# Patient Record
Sex: Male | Born: 1950 | Race: White | Hispanic: No | Marital: Married | Smoking: Former smoker
Health system: Southern US, Community
[De-identification: ages and names within clinical notes are randomized; demographics above are authoritative.]

## PROBLEM LIST (undated history)

## (undated) DIAGNOSIS — M1990 Unspecified osteoarthritis, unspecified site: Secondary | ICD-10-CM

## (undated) DIAGNOSIS — M170 Bilateral primary osteoarthritis of knee: Secondary | ICD-10-CM

## (undated) DIAGNOSIS — E538 Deficiency of other specified B group vitamins: Secondary | ICD-10-CM

## (undated) DIAGNOSIS — N200 Calculus of kidney: Secondary | ICD-10-CM

## (undated) DIAGNOSIS — Z87442 Personal history of urinary calculi: Secondary | ICD-10-CM

## (undated) HISTORY — DX: Calculus of kidney: N20.0

## (undated) HISTORY — DX: Bilateral primary osteoarthritis of knee: M17.0

## (undated) HISTORY — PX: TONSILLECTOMY: SUR1361

## (undated) HISTORY — DX: Unspecified osteoarthritis, unspecified site: M19.90

## (undated) HISTORY — PX: COLONOSCOPY: SHX174

---

## 2008-02-25 ENCOUNTER — Emergency Department: Payer: Self-pay | Admitting: Emergency Medicine

## 2008-04-20 ENCOUNTER — Observation Stay: Payer: Self-pay | Admitting: Urology

## 2008-07-22 ENCOUNTER — Emergency Department: Payer: Self-pay | Admitting: Unknown Physician Specialty

## 2008-12-13 ENCOUNTER — Ambulatory Visit: Payer: Self-pay | Admitting: Urology

## 2008-12-21 ENCOUNTER — Ambulatory Visit: Payer: Self-pay | Admitting: Urology

## 2009-01-03 ENCOUNTER — Ambulatory Visit: Payer: Self-pay | Admitting: Urology

## 2009-01-10 ENCOUNTER — Ambulatory Visit: Payer: Self-pay | Admitting: Urology

## 2011-07-11 ENCOUNTER — Ambulatory Visit: Payer: Self-pay | Admitting: Unknown Physician Specialty

## 2011-07-14 LAB — PATHOLOGY REPORT

## 2012-09-23 ENCOUNTER — Emergency Department: Payer: Self-pay | Admitting: Emergency Medicine

## 2013-08-19 DIAGNOSIS — N200 Calculus of kidney: Secondary | ICD-10-CM

## 2013-08-19 HISTORY — DX: Calculus of kidney: N20.0

## 2014-04-10 DIAGNOSIS — M170 Bilateral primary osteoarthritis of knee: Secondary | ICD-10-CM | POA: Insufficient documentation

## 2014-04-10 HISTORY — DX: Bilateral primary osteoarthritis of knee: M17.0

## 2014-07-07 NOTE — Op Note (Signed)
PATIENT NAME:  Danny Castro, Danny Castro MR#:  [id number] DATE OF BIRTH:  November 08, 1950  DATE OF PROCEDURE:  09/23/2012  PREPROCEDURE DIAGNOSIS: Retained nail in right thumb post nail gun injury.   POSTPROCEDURE DIAGNOSIS: Retained nail in right thumb post nail gun injury.   PROCEDURE: Removal of nail, right thumb.  ANESTHESIA: Local.   PROCEDURE: The patient was first given 10 mL of 1% Xylocaine as a digital block. After allowing this to set, the head of the screw, which was on the ulnar side, was grasped with a vice grip and the nail was removed without difficulty. There was some excessive bleeding from the more ulnar puncture wound and it appeared there was a small arterial injury. The patient had previously been given local anesthetic and neurovascular status could not be completely ascertained, but the puncture wound appeared to be entirely on the extensor side and the x-rays did not appear to show it penetrating the bone and with removal it did not appear to have gone through the bone. The wounds were addressed by ER staff. The patient is to follow up with me next week to check for infection.   COMPLICATIONS: None.   SPECIMEN: None. This nail was discarded in the sharps drawer.    ____________________________ Leitha SchullerMichael J. Shivangi Lutz, MD mjm:aw Castro: 09/23/2012 21:39:25 ET T: 09/24/2012 06:26:10 ET JOB#: [id number]  cc: Leitha SchullerMichael J. Charlet Harr, MD, <Dictator> Leitha SchullerMICHAEL J Adiya Selmer MD ELECTRONICALLY SIGNED 09/24/2012 6:55

## 2014-11-29 ENCOUNTER — Encounter: Payer: Self-pay | Admitting: Urology

## 2014-11-29 ENCOUNTER — Ambulatory Visit (INDEPENDENT_AMBULATORY_CARE_PROVIDER_SITE_OTHER): Payer: 59 | Admitting: Urology

## 2014-11-29 VITALS — BP 115/79 | HR 82 | Ht 70.0 in | Wt 162.6 lb

## 2014-11-29 DIAGNOSIS — R310 Gross hematuria: Secondary | ICD-10-CM | POA: Diagnosis not present

## 2014-11-29 NOTE — Progress Notes (Signed)
11/29/2014 3:53 PM   Danny Castro Mar 30, 1950 [id number]  Referring provider: Danella Penton, MD [address] [place], [place] [address]  Chief Complaint  Patient presents with  . Hematuria    possible nephrolithiasis, because pt got a history of stones. intermittent lower pelvic pain     HPI: The patient is a 64 year old male with a past medical history for nephrolithiasis who presents for microscopic hematuria. His last bout of kidney stones was in 2013. He does not believe he has passed one since. All previous stones were treated with shockwave lithotripsy. He denies any other urological problems including no nocturia, frequency, urgency, incontinence and, straining. Of note he had a recent PSA with his primary care doctor 0.5. His digital rectal exam at that time and would like to defer a rectal exam today.   PMH: Past Medical History  Diagnosis Date  . Nephrolithiasis   . OA (osteoarthritis)   . Calculus of kidney 08/19/2013  . Primary osteoarthritis of both knees 04/10/2014    Surgical History: Past Surgical History  Procedure Laterality Date  . Tonsillectomy      Home Medications:    Medication List       This list is accurate as of: 11/29/14  3:53 PM.  Always use your most recent med list.               RA NAPROXEN SODIUM 220 MG tablet  Generic drug:  naproxen sodium  Take 220 mg by mouth 2 (two) times daily.        Allergies: No Known Allergies  Family History: Family History  Problem Relation Age of Onset  . Prostate cancer Father   . Nephrolithiasis Father     Social History:  reports that he quit smoking about 4 years ago. He does not have any smokeless tobacco history on file. He reports that he drinks alcohol. He reports that he does not use illicit drugs.  ROS: UROLOGY Frequent Urination?: No Hard to postpone urination?: No Burning/pain with urination?: Yes Get up at night to urinate?: No Leakage of urine?: No Urine stream starts  and stops?: No Trouble starting stream?: No Do you have to strain to urinate?: No Blood in urine?: Yes Urinary tract infection?: No Sexually transmitted disease?: No Injury to kidneys or bladder?: No Painful intercourse?: No Weak stream?: No Erection problems?: No Penile pain?: No  Gastrointestinal Nausea?: No Vomiting?: No Indigestion/heartburn?: Yes Diarrhea?: No Constipation?: No  Constitutional Fever: No Night sweats?: No Weight loss?: No Fatigue?: Yes  Skin Skin rash/lesions?: No Itching?: No  Eyes Blurred vision?: Yes Double vision?: No  Ears/Nose/Throat Sore throat?: No Sinus problems?: Yes  Hematologic/Lymphatic Swollen glands?: No Easy bruising?: No  Cardiovascular Leg swelling?: No Chest pain?: No  Respiratory Cough?: No Shortness of breath?: No  Endocrine Excessive thirst?: No  Musculoskeletal Back pain?: Yes Joint pain?: Yes  Neurological Headaches?: Yes Dizziness?: Yes  Psychologic Depression?: Yes Anxiety?: Yes  Physical Exam: BP 115/79 mmHg  Pulse 82  Ht  (1.778 m)  Wt 162 lb 9.6 oz (73.755 kg)  BMI 23.33 kg/m2  Constitutional:  Alert and oriented, No acute distress. HEENT: [place] AT, moist mucus membranes.  Trachea midline, no masses. Cardiovascular: No clubbing, cyanosis, or edema. Respiratory: Normal respiratory effort, no increased work of breathing. GI: Abdomen is soft, nontender, nondistended, no abdominal masses GU: No CVA tenderness. Normal phallus. Testicles descended equal bilateral. No masses. DRE: Deferred per patient request. Skin: No rashes, bruises or suspicious  lesions. Lymph: No cervical or inguinal adenopathy. Neurologic: Grossly intact, no focal deficits, moving all 4 extremities. Psychiatric: Normal mood and affect.  Laboratory Data: PSA: 0.5 (8/16)  Urinalysis No results found for: COLORURINE, APPEARANCEUR, LABSPEC, PHURINE, GLUCOSEU, HGBUR, BILIRUBINUR, KETONESUR, PROTEINUR, UROBILINOGEN,  NITRITE, LEUKOCYTESUR   Assessment & Plan:    I discussed the patient that the workup for microscopic hematuria is very standardized. I discussed with him that there is a good chance that the etiology is from nephrolithiasis given his history, however especially given his smoking history, a full workup is necessary. We will start the process today.  The patient is re: been screened by his primary care doctor for prostate cancer in August. His PSA is unremarkable. He deferred a digital rectal exam today. I did discuss with him the controversial guidelines of prostate cancer screening. In particular, the variance between the American Urological Association in the USPTF.  1. Microscopic Hematuria - Urinalysis, Complete - CULTURE, URINE COMPREHENSIVE - CT Abdomen Pelvis W Wo Contrast; Urogram - Cystoscopy  2. Prostate Cancer Screening - Repeat PSA August 2016.  Patient deferred a rectal exam today, but it was normal per the patient at his last physical in August.  Return in about 2 weeks (around 12/13/2014) for cystoscopy.  After CT Urogram.  Hildred Laser, MD  [facility] [address], [address] [place], [place] [address] [phone]

## 2014-11-30 LAB — MICROSCOPIC EXAMINATION
BACTERIA UA: NONE SEEN
EPITHELIAL CELLS (NON RENAL): NONE SEEN /HPF (ref 0–10)

## 2014-11-30 LAB — URINALYSIS, COMPLETE
Bilirubin, UA: NEGATIVE
Glucose, UA: NEGATIVE
Leukocytes, UA: NEGATIVE
NITRITE UA: NEGATIVE
PH UA: 5.5 (ref 5.0–7.5)
Protein, UA: NEGATIVE
RBC UA: NEGATIVE
Specific Gravity, UA: >1.03 — ABNORMAL HIGH (ref 1.005–1.030)
UUROB: 0.2 mg/dL (ref 0.2–1.0)

## 2014-12-03 LAB — CULTURE, URINE COMPREHENSIVE

## 2014-12-05 ENCOUNTER — Other Ambulatory Visit: Payer: Self-pay | Admitting: Urology

## 2014-12-05 ENCOUNTER — Telehealth: Payer: Self-pay | Admitting: Urology

## 2014-12-05 MED ORDER — CIPROFLOXACIN HCL 500 MG PO TABS
500.0000 mg | ORAL_TABLET | Freq: Two times a day (BID) | ORAL | Status: DC
Start: 2014-12-05 — End: 2014-12-28

## 2014-12-05 NOTE — Telephone Encounter (Signed)
Left mess to call 

## 2014-12-05 NOTE — Telephone Encounter (Signed)
-----   Message from Hildred Laser, MD sent at 12/05/2014  9:18 AM EDT ----- Mr. Mikami has a positive urine culture.  Can we call him to let him know to pick up Cipro at this pharmacy to treat it?  Thanks

## 2014-12-06 ENCOUNTER — Encounter: Payer: Self-pay | Admitting: Urology

## 2014-12-11 ENCOUNTER — Ambulatory Visit: Admission: RE | Admit: 2014-12-11 | Payer: 59 | Source: Ambulatory Visit

## 2014-12-12 NOTE — Telephone Encounter (Signed)
Left pt mess to call/SW 

## 2014-12-13 ENCOUNTER — Ambulatory Visit: Payer: 59

## 2014-12-14 ENCOUNTER — Other Ambulatory Visit: Payer: 59

## 2014-12-14 NOTE — Telephone Encounter (Signed)
Spoke with patient and he states he received my vmail and he started the abx for treatment of UTI/SW

## 2014-12-15 ENCOUNTER — Ambulatory Visit
Admission: RE | Admit: 2014-12-15 | Discharge: 2014-12-15 | Disposition: A | Discharge status: Home / Self Care | Payer: 59 | Source: Ambulatory Visit | Attending: Urology | Admitting: Urology

## 2014-12-15 DIAGNOSIS — N200 Calculus of kidney: Secondary | ICD-10-CM | POA: Insufficient documentation

## 2014-12-15 DIAGNOSIS — R310 Gross hematuria: Secondary | ICD-10-CM

## 2014-12-15 MED ORDER — IOHEXOL 300 MG/ML  SOLN
150.0000 mL | Freq: Once | INTRAMUSCULAR | Status: AC | PRN
  Administered 2014-12-15: 125 mL via INTRAVENOUS

## 2014-12-28 ENCOUNTER — Ambulatory Visit (INDEPENDENT_AMBULATORY_CARE_PROVIDER_SITE_OTHER): Payer: 59 | Admitting: Urology

## 2014-12-28 ENCOUNTER — Encounter: Payer: Self-pay | Admitting: Urology

## 2014-12-28 VITALS — BP 137/82 | HR 88 | Ht 70.0 in | Wt 162.9 lb

## 2014-12-28 DIAGNOSIS — N200 Calculus of kidney: Secondary | ICD-10-CM

## 2014-12-28 MED ORDER — CIPROFLOXACIN HCL 500 MG PO TABS: 500.0000 mg | ORAL_TABLET | Freq: Once | ORAL | Status: DC

## 2014-12-28 MED ORDER — LIDOCAINE HCL 2 % EX GEL: 1.0000 "application " | Freq: Once | CUTANEOUS | Status: DC

## 2014-12-28 NOTE — Addendum Note (Signed)
Addended by: Martha ClanWATTS, SARAH M on: 12/28/2014 04:38 PM   Modules accepted: Orders

## 2014-12-28 NOTE — Progress Notes (Signed)
12/28/2014 4:10 PM   Danny Castro 04/06/1950 [id number]  Referring provider: Danella PentonMark F Miller, MD [phone] [facility] [place], [place] [id number]  Chief Complaint  Patient presents with  . Cysto    HPI: The patient transferred completion of his microhematuria workup. CT scan showed bilateral nephrolithiasis with stone burden worse on the right side. He does have a history of stones. He was last treated with ESWL in 2013. He has passed 3 stones before.  He is adamantly in refusing a cystoscopy at this time.   PMH: Past Medical History  Diagnosis Date  . Nephrolithiasis   . OA (osteoarthritis)   . Calculus of kidney 08/19/2013  . Primary osteoarthritis of both knees 04/10/2014    Surgical History: Past Surgical History  Procedure Laterality Date  . Tonsillectomy      Home Medications:    Medication List       This list is accurate as of: 12/28/14  4:10 PM.  Always use your most recent med list.               ciprofloxacin 500 MG tablet  Commonly known as:  CIPRO  Take 1 tablet (500 mg total) by mouth every 12 (twelve) hours.     RA NAPROXEN SODIUM 220 MG tablet  Generic drug:  naproxen sodium  Take 220 mg by mouth 2 (two) times daily.        Allergies: No Known Allergies  Family History: Family History  Problem Relation Age of Onset  . Prostate cancer Father   . Nephrolithiasis Father     Social History:  reports that he quit smoking about 4 years ago. He does not have any smokeless tobacco history on file. He reports that he drinks alcohol. He reports that he does not use illicit drugs.  ROS:                                        Physical Exam: There were no vitals taken for this visit.  Constitutional:  Alert and oriented, No acute distress. HEENT: [place] AT, moist mucus membranes.  Trachea midline, no masses. Cardiovascular: No clubbing, cyanosis, or edema. Respiratory: Normal respiratory  effort, no increased work of breathing. GI: Abdomen is soft, nontender, nondistended, no abdominal masses GU: No CVA tenderness.  Skin: No rashes, bruises or suspicious lesions. Lymph: No cervical or inguinal adenopathy. Neurologic: Grossly intact, no focal deficits, moving all 4 extremities. Psychiatric: Normal mood and affect.  Laboratory Data: No results found for: WBC, HGB, HCT, MCV, PLT  No results found for: CREATININE  No results found for: PSA  No results found for: TESTOSTERONE  No results found for: HGBA1C  Urinalysis    Component Value Date/Time   GLUCOSEU Negative 11/29/2014 1520   BILIRUBINUR Negative 11/29/2014 1520   NITRITE Negative 11/29/2014 1520   LEUKOCYTESUR Negative 11/29/2014 1520    Pertinent Imaging:  CLINICAL DATA: Micro hematuria. History of renal calculi.  EXAM: CT ABDOMEN AND PELVIS WITHOUT AND WITH CONTRAST  TECHNIQUE: Multidetector CT imaging of the abdomen and pelvis was performed following the standard protocol before and following the bolus administration of intravenous contrast.  CONTRAST: 125mL OMNIPAQUE IOHEXOL 300 MG/ML SOLN  COMPARISON: CT 04/21/2007  FINDINGS: Lower chest: Lung bases are clear.  Hepatobiliary: No focal hepatic lesion. No biliary duct dilatation. Gallbladder is normal. Common bile duct is normal.  Pancreas: Pancreas is normal. No ductal dilatation. No pancreatic inflammation.  Spleen: Normal spleen  Adrenals/urinary tract: Adrenal glands normal.  Non IV contrast images demonstrate 8 calculi within the RIGHT kidney ranging in size from 1 to 7 mm. Six calculi within the LEFT kidney ranging in size from 2 to 7 mm. No ureterolithiasis or obstructive uropathy.  Delayed cortical phase imaging demonstrates no enhancing renal cortical lesion. Delayed pyelogram phase imaging demonstrates no filling defects in collecting systems or ureters.  No bladder calculi, enhancing bladder lesions,  or filling defect within the bladder.  Stomach/Bowel: Stomach, small-bowel, appendix, cecum normal. Central mesenteries mild fat stranding and multiple small lymph nodes scattered throughout the mesenteries best seen on coronal images 48 through 63. This finding is progressed from 2010 but was evident at that time.  Vascular/Lymphatic: Abdominal aorta is normal caliber. There is no retroperitoneal or periportal lymphadenopathy. No pelvic lymphadenopathy.  Reproductive: Prostate gland is normal. Normal seminal vesicles.  Musculoskeletal: No aggressive osseous lesion.  Other: No free fluid.  IMPRESSION: 1. Bilateral nephrolithiasis. No ureterolithiasis or obstructive uropathy. 2. No enhancing renal cortical lesion or filling defect within the collecting systems. 3. No bladder stones or filling defects in the bladder which does not excluded a bladder lesion. 4. Prominent central mesenteric lymph nodes progressed from 2010 are favored benign. 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     Cystoscopy Procedure Note  Patient identification was confirmed, informed consent was obtained, and patient was prepped using Betadine solution.  Lidocaine jelly was administered per urethral meatus.     Assessment & Plan:    1. Calculus of kidney He has multiple calculi on both sides. The stone burden on the right is worse. He is at least 2 stones around 6-7 cm on the right side. We will start with lithotripsy on the right. He may need additional procedures on the left future. The patient is aware of the risks, benefits, and alternatives to lithotripsy. He wishes to proceed. Once he recovers from this surgery, we'll plan to do a 24-hour urine study on him due to his recurrent stone formation. - Urinalysis, Complete   2.  Microhematuria -Patient is refusing cystoscopy. Otherwise negative work up except for stones. I stressed the necessity of a cystoscopy today, especially  given his smoking history. He will consider having it done in the future.   Follow up: Right ESWL  Hildred Laser, MD  [facility] [address] [place], [place] [address] [phone]

## 2014-12-29 LAB — URINALYSIS, COMPLETE
Bilirubin, UA: NEGATIVE
GLUCOSE, UA: NEGATIVE
Leukocytes, UA: NEGATIVE
Nitrite, UA: NEGATIVE
Protein, UA: NEGATIVE
Specific Gravity, UA: 1.025 (ref 1.005–1.030)
Urobilinogen, Ur: 1 mg/dL (ref 0.2–1.0)
pH, UA: 6.5 (ref 5.0–7.5)

## 2014-12-29 LAB — MICROSCOPIC EXAMINATION
Epithelial Cells (non renal): NONE SEEN /hpf (ref 0–10)
Renal Epithel, UA: NONE SEEN /hpf
WBC UA: NONE SEEN /HPF (ref 0–?)

## 2014-12-30 LAB — CULTURE, URINE COMPREHENSIVE

## 2015-01-03 MED ORDER — PROMETHAZINE HCL 25 MG/ML IJ SOLN: 25.0000 mg | Freq: Once | INTRAMUSCULAR | Status: DC

## 2015-01-04 ENCOUNTER — Encounter
Admission: RE | Disposition: A | Discharge status: Home / Self Care | Payer: Self-pay | Source: Ambulatory Visit | Attending: Urology

## 2015-01-04 ENCOUNTER — Ambulatory Visit
Admission: RE | Admit: 2015-01-04 | Discharge: 2015-01-04 | Disposition: A | Discharge status: Home / Self Care | Payer: 59 | Source: Ambulatory Visit | Attending: Urology | Admitting: Urology

## 2015-01-04 ENCOUNTER — Ambulatory Visit: Payer: 59

## 2015-01-04 ENCOUNTER — Encounter: Payer: Self-pay | Admitting: *Deleted

## 2015-01-04 DIAGNOSIS — N200 Calculus of kidney: Secondary | ICD-10-CM | POA: Diagnosis not present

## 2015-01-04 HISTORY — PX: EXTRACORPOREAL SHOCK WAVE LITHOTRIPSY: SHX1557

## 2015-01-04 SURGERY — LITHOTRIPSY, ESWL
Anesthesia: Moderate Sedation | Laterality: Right

## 2015-01-04 MED ORDER — CIPROFLOXACIN HCL 500 MG PO TABS
ORAL_TABLET | ORAL | Status: AC
  Filled 2015-01-04: qty 1

## 2015-01-04 MED ORDER — ONDANSETRON HCL 4 MG/2ML IJ SOLN
4.0000 mg | Freq: Once | INTRAMUSCULAR | Status: AC
  Administered 2015-01-04: 4 mg via INTRAVENOUS

## 2015-01-04 MED ORDER — DEXTROSE-NACL 5-0.45 % IV SOLN
INTRAVENOUS | Status: DC
  Administered 2015-01-04: 13:00:00 via INTRAVENOUS

## 2015-01-04 MED ORDER — DIAZEPAM 5 MG PO TABS
ORAL_TABLET | ORAL | Status: AC
  Filled 2015-01-04: qty 2

## 2015-01-04 MED ORDER — DIPHENHYDRAMINE HCL 25 MG PO CAPS
ORAL_CAPSULE | ORAL | Status: AC
  Filled 2015-01-04: qty 1

## 2015-01-04 MED ORDER — DIPHENHYDRAMINE HCL 25 MG PO CAPS
25.0000 mg | ORAL_CAPSULE | ORAL | Status: AC
  Administered 2015-01-04: 25 mg via ORAL

## 2015-01-04 MED ORDER — CIPROFLOXACIN HCL 500 MG PO TABS
500.0000 mg | ORAL_TABLET | ORAL | Status: AC
  Administered 2015-01-04: 500 mg via ORAL

## 2015-01-04 MED ORDER — DIAZEPAM 5 MG PO TABS
10.0000 mg | ORAL_TABLET | ORAL | Status: AC
  Administered 2015-01-04: 10 mg via ORAL

## 2015-01-04 MED ORDER — ONDANSETRON HCL 4 MG/2ML IJ SOLN
INTRAMUSCULAR | Status: AC
  Filled 2015-01-04: qty 2

## 2015-01-04 NOTE — Progress Notes (Signed)
Advises he did mag citrate at home yest pm Bathroom void preop 145 pm prior to transport

## 2015-01-04 NOTE — Discharge Instructions (Signed)

## 2015-01-19 ENCOUNTER — Encounter: Payer: Self-pay | Admitting: Urology

## 2015-01-19 ENCOUNTER — Ambulatory Visit: Payer: 59 | Admitting: Urology

## 2016-03-01 ENCOUNTER — Emergency Department: Payer: Medicare Other

## 2016-03-01 ENCOUNTER — Emergency Department
Admission: EM | Admit: 2016-03-01 | Discharge: 2016-03-01 | Disposition: A | Discharge status: Home / Self Care | Payer: Medicare Other | Attending: Emergency Medicine | Admitting: Emergency Medicine

## 2016-03-01 DIAGNOSIS — Z87891 Personal history of nicotine dependence: Secondary | ICD-10-CM | POA: Diagnosis not present

## 2016-03-01 DIAGNOSIS — R109 Unspecified abdominal pain: Secondary | ICD-10-CM | POA: Diagnosis present

## 2016-03-01 DIAGNOSIS — N200 Calculus of kidney: Secondary | ICD-10-CM | POA: Diagnosis not present

## 2016-03-01 LAB — URINALYSIS, COMPLETE (UACMP) WITH MICROSCOPIC
Bacteria, UA: NONE SEEN
Bilirubin Urine: NEGATIVE
GLUCOSE, UA: NEGATIVE mg/dL
HGB URINE DIPSTICK: NEGATIVE
Ketones, ur: 5 mg/dL — AB
Leukocytes, UA: NEGATIVE
NITRITE: NEGATIVE
PROTEIN: NEGATIVE mg/dL
Specific Gravity, Urine: 1.011 (ref 1.005–1.030)
Squamous Epithelial / LPF: NONE SEEN
pH: 8 (ref 5.0–8.0)

## 2016-03-01 LAB — CBC
HEMATOCRIT: 45.5 % (ref 40.0–52.0)
Hemoglobin: 15.4 g/dL (ref 13.0–18.0)
MCH: 30.4 pg (ref 26.0–34.0)
MCHC: 33.8 g/dL (ref 32.0–36.0)
MCV: 89.9 fL (ref 80.0–100.0)
Platelets: 194 10*3/uL (ref 150–440)
RBC: 5.06 MIL/uL (ref 4.40–5.90)
RDW: 14.5 % (ref 11.5–14.5)
WBC: 10.3 10*3/uL (ref 3.8–10.6)

## 2016-03-01 LAB — BASIC METABOLIC PANEL
Anion gap: 8 (ref 5–15)
BUN: 20 mg/dL (ref 6–20)
CO2: 24 mmol/L (ref 22–32)
Calcium: 8.7 mg/dL — ABNORMAL LOW (ref 8.9–10.3)
Chloride: 105 mmol/L (ref 101–111)
Creatinine, Ser: 0.88 mg/dL (ref 0.61–1.24)
GFR calc Af Amer: >60 mL/min (ref >60)
GLUCOSE: 130 mg/dL — AB (ref 65–99)
POTASSIUM: 4.4 mmol/L (ref 3.5–5.1)
Sodium: 137 mmol/L (ref 135–145)

## 2016-03-01 MED ORDER — HYDROMORPHONE HCL 1 MG/ML IJ SOLN
1.0000 mg | Freq: Once | INTRAMUSCULAR | Status: AC
  Administered 2016-03-01: 1 mg via INTRAVENOUS

## 2016-03-01 MED ORDER — ONDANSETRON HCL 4 MG/2ML IJ SOLN
INTRAMUSCULAR | Status: AC
  Administered 2016-03-01: 4 mg via INTRAVENOUS
  Filled 2016-03-01: qty 2

## 2016-03-01 MED ORDER — MORPHINE SULFATE (PF) 4 MG/ML IV SOLN
INTRAVENOUS | Status: AC
  Administered 2016-03-01: 4 mg via INTRAVENOUS
  Filled 2016-03-01: qty 1

## 2016-03-01 MED ORDER — TAMSULOSIN HCL 0.4 MG PO CAPS: 0.4000 mg | ORAL_CAPSULE | Freq: Every day | ORAL | 0 refills | Status: DC

## 2016-03-01 MED ORDER — KETOROLAC TROMETHAMINE 30 MG/ML IJ SOLN
INTRAMUSCULAR | Status: AC
  Administered 2016-03-01: 30 mg via INTRAVENOUS
  Filled 2016-03-01: qty 1

## 2016-03-01 MED ORDER — ONDANSETRON 4 MG PO TBDP: 4.0000 mg | ORAL_TABLET | Freq: Three times a day (TID) | ORAL | 0 refills | Status: DC | PRN

## 2016-03-01 MED ORDER — ONDANSETRON HCL 4 MG/2ML IJ SOLN
4.0000 mg | Freq: Once | INTRAMUSCULAR | Status: AC
  Administered 2016-03-01: 4 mg via INTRAVENOUS

## 2016-03-01 MED ORDER — MORPHINE SULFATE (PF) 4 MG/ML IV SOLN
4.0000 mg | Freq: Once | INTRAVENOUS | Status: AC
  Administered 2016-03-01: 4 mg via INTRAVENOUS

## 2016-03-01 MED ORDER — OXYCODONE-ACETAMINOPHEN 5-325 MG PO TABS
ORAL_TABLET | ORAL | Status: AC
  Filled 2016-03-01: qty 1

## 2016-03-01 MED ORDER — OXYCODONE-ACETAMINOPHEN 5-325 MG PO TABS
1.0000 | ORAL_TABLET | Freq: Once | ORAL | Status: AC
  Administered 2016-03-01: 1 via ORAL

## 2016-03-01 MED ORDER — SODIUM CHLORIDE 0.9 % IV BOLUS (SEPSIS)
1000.0000 mL | Freq: Once | INTRAVENOUS | Status: AC
  Administered 2016-03-01: 1000 mL via INTRAVENOUS

## 2016-03-01 MED ORDER — OXYCODONE-ACETAMINOPHEN 5-325 MG PO TABS: 1.0000 | ORAL_TABLET | ORAL | 0 refills | Status: DC | PRN

## 2016-03-01 MED ORDER — KETOROLAC TROMETHAMINE 30 MG/ML IJ SOLN
30.0000 mg | Freq: Once | INTRAMUSCULAR | Status: AC
  Administered 2016-03-01: 30 mg via INTRAVENOUS

## 2016-03-01 NOTE — ED Notes (Signed)
Pt to stat desk in wheelchair, pt in obvious pain, reports pain to right flank, reports hx kidney stones, reports n/v as well, pt taken to rm 11, primary nurse informed

## 2016-03-01 NOTE — ED Notes (Signed)
Patient reports being awoken out of sleep this morning at 0200 with severe right flank pain. Pt reports short/small bursts in urinary stream X 2 days. Pt reports prior kidney stones - last stone 2016 on left side; patient reports he had lithotripsy performed

## 2016-03-01 NOTE — ED Triage Notes (Signed)
Pt taken to rm 11. Pt reprots hx of kidney stones and states around 2 am he woke up with his normal pain from kidney stones but to his right lower quad. Pt usually has them on the left. +N/V

## 2016-03-01 NOTE — ED Notes (Signed)
  Reviewed d/c instructions, follow-up care, prescriptions, need to increase fluids. Pt verbalized understanding.

## 2016-03-01 NOTE — ED Provider Notes (Signed)
[facility] Emergency Department Provider Note    First MD Initiated Contact with Patient 03/01/16 0345     (approximate)  I have reviewed the triage vital signs and the nursing notes.   HISTORY  Chief Complaint Nephrolithiasis    HPI Danny Castro is a 65 y.o. male below list chronic medical conditions presents to the emergency department with acute onset of right flank pain 2 AM this morning that awoke him from sleep. Patient states current pain score is 10 out of 10. Patient admits to nausea and vomiting. Patient denies any fever or febrile on presentation temperature 97.7   Past Medical History:  Diagnosis Date  . Calculus of kidney 08/19/2013  . Nephrolithiasis   . OA (osteoarthritis)   . Primary osteoarthritis of both knees 04/10/2014    Patient Active Problem List   Diagnosis Date Noted  . Primary osteoarthritis of both knees 04/10/2014  . Calculus of kidney 08/19/2013    Past Surgical History:  Procedure Laterality Date  . EXTRACORPOREAL SHOCK WAVE LITHOTRIPSY Right 01/04/2015   Procedure: EXTRACORPOREAL SHOCK WAVE LITHOTRIPSY (ESWL);  Surgeon: Lorraine Laxichard D Hart, MD;  Location: [facility] ORS;  Service: Urology;  Laterality: Right;  . TONSILLECTOMY      Prior to Admission medications   Not on File    Allergies Patient has no known allergies.  Family History  Problem Relation Age of Onset  . Prostate cancer Father   . Nephrolithiasis Father     Social History Social History  Substance Use Topics  . Smoking status: Former Smoker    Quit date: 11/29/2010  . Smokeless tobacco: Not on file  . Alcohol use 0.0 oz/week    Review of Systems Constitutional: No fever/chills Eyes: No visual changes. ENT: No sore throat. Cardiovascular: Denies chest pain. Respiratory: Denies shortness of breath. Gastrointestinal: Positive for right flank pain. No nausea, no vomiting.  No diarrhea.  No constipation. Genitourinary: Negative for  dysuria. Musculoskeletal: Negative for back pain. Skin: Negative for rash. Neurological: Negative for headaches, focal weakness or numbness.  10-point ROS otherwise negative.  ____________________________________________   PHYSICAL EXAM:  VITAL SIGNS: ED Triage Vitals  Enc Vitals Group     BP 03/01/16 0350 (!) 186/97     Pulse Rate 03/01/16 0350 79     Resp 03/01/16 0350 (!) 22     Temp 03/01/16 0350 97.7 F (36.5 C)     Temp Source 03/01/16 0350 Oral     SpO2 03/01/16 0350 100 %     Weight 03/01/16 0350 165 lb (74.8 kg)     Height 03/01/16 0350 5\' 9"  (1.753 m)     Head Circumference --      Peak Flow --      Pain Score 03/01/16 0351 10     Pain Loc --      Pain Edu? --      Excl. in GC? --     Constitutional: Alert and oriented. Apparent discomfort  Eyes: Conjunctivae are normal. PERRL. EOMI. Head: Atraumatic. Mouth/Throat: Mucous membranes are moist.  Oropharynx non-erythematous. Neck: No stridor.   Cardiovascular: Normal rate, regular rhythm. Good peripheral circulation. Grossly normal heart sounds. Respiratory: Normal respiratory effort.  No retractions. Lungs CTAB. Gastrointestinal: Soft and nontender. No distention.  Musculoskeletal: No lower extremity tenderness nor edema. No gross deformities of extremities. Neurologic:  Normal speech and language. No gross focal neurologic deficits are appreciated.  Skin:  Skin is warm, dry and intact. No rash noted. Psychiatric: Mood  and affect are normal. Speech and behavior are normal.  ____________________________________________   LABS (all labs ordered are listed, but only abnormal results are displayed)  Labs Reviewed  BASIC METABOLIC PANEL - Abnormal; Notable for the following:       Result Value   Glucose, Bld 130 (*)    Calcium 8.7 (*)    All other components within normal limits  CBC  URINALYSIS, COMPLETE (UACMP) WITH MICROSCOPIC    RADIOLOGY I, Alta N Jolon Degante, personally viewed and evaluated these  images (plain radiographs) as part of my medical decision making, as well as reviewing the written report by the radiologist.  Ct Renal Stone Study  Result Date: 03/01/2016 CLINICAL DATA:  Woke up with severe RIGHT flank pain at 0200 hours. History of kidney stones and lithotripsy. EXAM: CT ABDOMEN AND PELVIS WITHOUT CONTRAST TECHNIQUE: Multidetector CT imaging of the abdomen and pelvis was performed following the standard protocol without IV contrast. COMPARISON:  Abdominal radiograph January 12, 2015 FINDINGS: LOWER CHEST: Dependent atelectasis. Heart is mildly enlarged. No pericardial effusions. HEPATOBILIARY: Normal. PANCREAS: Normal. SPLEEN: Normal. ADRENALS/URINARY TRACT: Kidneys are orthotopic, demonstrating normal size and morphology. Mild RIGHT hydronephrosis to the ureteral pelvic junction where a 6 mm calculus is present. Approximate 5 residual RIGHT nephrolithiasis, 2 within the RIGHT renal pelvis is measuring up to 6 mm. Approximate 11 LEFT nephrolithiasis measure up to 4 mm. No LEFT obstructive uropathy. Limited assessment for renal masses on this nonenhanced examination. The unopacified ureters are normal in course and caliber. Urinary bladder is partially distended and unremarkable. Normal adrenal glands. STOMACH/BOWEL: Very small hiatal hernia. The stomach, small and large bowel are normal in course and caliber without inflammatory changes, sensitivity decreased by lack of enteric contrast. Moderate amount of retained large bowel stool. Moderate sigmoid diverticulosis. Normal appendix. VASCULAR/LYMPHATIC: Aortoiliac vessels are normal in course and caliber, mild calcific atherosclerosis. "Misty" mesentery and prominent mesenteric lymph nodes measuring up to 11 mm are likely reactive. REPRODUCTIVE: Mild prostatomegaly. OTHER: No intraperitoneal free fluid or free air. MUSCULOSKELETAL: Non-acute. Small bilateral fat containing inguinal hernias. Severe L4-5 degenerative disc, moderate to severe at  L5-S1. Severe RIGHT L4-5, severe LEFT L5-S1 neural foraminal narrowing. IMPRESSION: 5 mm RIGHT ureteropelvic junction calculus results in mild hydronephrosis. Bilateral residual nephrolithiasis measuring up to 6 mm on the RIGHT. No LEFT obstructive uropathy. Mesenteric edema and borderline mesenteric lymphadenopathy is likely reactive. Electronically Signed   By: Awilda Metro M.D.   On: 03/01/2016 04:33     Procedures      INITIAL IMPRESSION / ASSESSMENT AND PLAN / ED COURSE  Pertinent labs & imaging results that were available during my care of the patient were reviewed by me and considered in my medical decision making (see chart for details).  Patient given IV morphine 4 mg and Toradol 30 mg without improvement of pain. Patient then given Dilantin 1 mg with pain improvement.   Clinical Course     ____________________________________________  FINAL CLINICAL IMPRESSION(S) / ED DIAGNOSES  Final diagnoses:  Right kidney stone     MEDICATIONS GIVEN DURING THIS VISIT:  Medications  sodium chloride 0.9 % bolus 1,000 mL (1,000 mLs Intravenous New Bag/Given 03/01/16 0358)  ketorolac (TORADOL) 30 MG/ML injection 30 mg (30 mg Intravenous Given 03/01/16 0400)  morphine 4 MG/ML injection 4 mg (4 mg Intravenous Given 03/01/16 0400)  ondansetron (ZOFRAN) injection 4 mg (4 mg Intravenous Given 03/01/16 0400)  HYDROmorphone (DILAUDID) injection 1 mg (1 mg Intravenous Given 03/01/16 0411)  NEW OUTPATIENT MEDICATIONS STARTED DURING THIS VISIT:  New Prescriptions   No medications on file    Modified Medications   No medications on file    Discontinued Medications   No medications on file     Note:  This document was prepared using Dragon voice recognition software and may include unintentional dictation errors.    Darci Currentandolph N Bitha Fauteux, MD 03/01/16 [phone]

## 2016-03-01 NOTE — ED Notes (Addendum)
Patient transported to CT 

## 2016-03-04 ENCOUNTER — Ambulatory Visit (INDEPENDENT_AMBULATORY_CARE_PROVIDER_SITE_OTHER): Payer: Medicare Other | Admitting: Urology

## 2016-03-04 ENCOUNTER — Encounter: Payer: Self-pay | Admitting: Urology

## 2016-03-04 VITALS — BP 135/84 | HR 103 | Ht 69.0 in | Wt 161.5 lb

## 2016-03-04 DIAGNOSIS — N200 Calculus of kidney: Secondary | ICD-10-CM | POA: Diagnosis not present

## 2016-03-04 LAB — URINALYSIS, COMPLETE
BILIRUBIN UA: NEGATIVE
Glucose, UA: NEGATIVE
Ketones, UA: NEGATIVE
LEUKOCYTES UA: NEGATIVE
Nitrite, UA: NEGATIVE
PH UA: 5.5 (ref 5.0–7.5)
PROTEIN UA: NEGATIVE
RBC UA: NEGATIVE
Specific Gravity, UA: 1.015 (ref 1.005–1.030)
Urobilinogen, Ur: 0.2 mg/dL (ref 0.2–1.0)

## 2016-03-04 LAB — MICROSCOPIC EXAMINATION: BACTERIA UA: NONE SEEN

## 2016-03-04 MED ORDER — TAMSULOSIN HCL 0.4 MG PO CAPS: 0.4000 mg | ORAL_CAPSULE | Freq: Every day | ORAL | 0 refills | Status: AC

## 2016-03-04 NOTE — Progress Notes (Signed)
03/04/2016 11:05 AM   Danny Castro [birth year] [id number]  Referring provider: Rusty Aus, MD [facility] [place], [place] [address]  Chief Complaint  Patient presents with  . Nephrolithiasis    HPI: Danny Castro is a 65yo her for evaluation of bilateral renal calculi and a right ureteral calculus.  This is his 5th stone event. His last event was over 1 year ago. He had ESWL over 1 year ago. 03/01/2016 he presented to [facility] with severe, sharp, intermittent, nonradiating right flank pain. He was diagnosed with a 26m R UPJ calculus with 2 4-678mright renal pelvis calculus. The pain is now mild, sharp, intermittent, located in the lower flank. He denies any LUTS. No hematuria. He has associated nausea. No exacerbating events. Pain alleviated with percocet. No other associated signs/symptoms   PMH: Past Medical History:  Diagnosis Date  . Calculus of kidney 08/19/2013  . Nephrolithiasis   . OA (osteoarthritis)   . Primary osteoarthritis of both knees 04/10/2014    Surgical History: Past Surgical History:  Procedure Laterality Date  . EXTRACORPOREAL SHOCK WAVE LITHOTRIPSY Right 01/04/2015   Procedure: EXTRACORPOREAL SHOCK WAVE LITHOTRIPSY (ESWL);  Surgeon: RiCollier FlowersMD;  Location: [facility] ORS;  Service: Urology;  Laterality: Right;  . TONSILLECTOMY      Home Medications:  Allergies as of 03/04/2016   No Known Allergies     Medication List       Accurate as of 03/04/16 11:05 AM. Always use your most recent med list.          omeprazole 20 MG capsule Commonly known as:  PRILOSEC Take by mouth.   ondansetron 4 MG disintegrating tablet Commonly known as:  ZOFRAN ODT Take 1 tablet (4 mg total) by mouth every 8 (eight) hours as needed for nausea or vomiting.   oxyCODONE-acetaminophen 5-325 MG tablet Commonly known as:  ROXICET Take 1 tablet by mouth every 4 (four) hours as needed for severe pain.   tamsulosin 0.4 MG Caps  capsule Commonly known as:  FLOMAX Take 1 capsule (0.4 mg total) by mouth daily after breakfast.       Allergies: No Known Allergies  Family History: Family History  Problem Relation Age of Onset  . Prostate cancer Father   . Nephrolithiasis Father     Social History:  reports that he quit smoking about 5 years ago. He does not have any smokeless tobacco history on file. He reports that he drinks alcohol. He reports that he does not use drugs.  ROS: UROLOGY Frequent Urination?: No Hard to postpone urination?: No Burning/pain with urination?: Yes Get up at night to urinate?: No Leakage of urine?: No Urine stream starts and stops?: No Trouble starting stream?: Yes Do you have to strain to urinate?: No Blood in urine?: No Urinary tract infection?: No Sexually transmitted disease?: No Injury to kidneys or bladder?: No Painful intercourse?: Yes Weak stream?: No Erection problems?: No Penile pain?: No  Gastrointestinal Nausea?: No Vomiting?: No Indigestion/heartburn?: No Diarrhea?: No Constipation?: No  Constitutional Fever: No Night sweats?: No Weight loss?: No Fatigue?: No  Skin Skin rash/lesions?: No Itching?: No  Eyes Blurred vision?: No Double vision?: No  Ears/Nose/Throat Sore throat?: No Sinus problems?: No  Hematologic/Lymphatic Swollen glands?: No Easy bruising?: No  Cardiovascular Leg swelling?: No Chest pain?: No  Respiratory Cough?: No Shortness of breath?: No  Endocrine Excessive thirst?: No  Musculoskeletal Back pain?: No Joint pain?: No  Neurological Headaches?: Yes Dizziness?:  No  Psychologic Depression?: No Anxiety?: No  Physical Exam: BP 135/84   Pulse (!) 103   Ht 5' 9" (1.753 m)   Wt 73.3 kg (161 lb 8 oz)   BMI 23.85 kg/m   Constitutional:  Alert and oriented, No acute distress. HEENT: [place] AT, moist mucus membranes.  Trachea midline, no masses. Cardiovascular: No clubbing, cyanosis, or edema. Respiratory:  Normal respiratory effort, no increased work of breathing. GI: Abdomen is soft, nontender, nondistended, no abdominal masses GU: No CVA tenderness.  Skin: No rashes, bruises or suspicious lesions. Lymph: No cervical or inguinal adenopathy. Neurologic: Grossly intact, no focal deficits, moving all 4 extremities. Psychiatric: Normal mood and affect.  Laboratory Data: Lab Results  Component Value Date   WBC 10.3 03/01/2016   HGB 15.4 03/01/2016   HCT 45.5 03/01/2016   MCV 89.9 03/01/2016   PLT 194 03/01/2016    Lab Results  Component Value Date   CREATININE 0.88 03/01/2016    No results found for: PSA  No results found for: TESTOSTERONE  No results found for: HGBA1C  Urinalysis    Component Value Date/Time   COLORURINE STRAW (A) 03/01/2016 0520   APPEARANCEUR CLEAR (A) 03/01/2016 0520   APPEARANCEUR Clear 12/28/2014 1606   LABSPEC 1.011 03/01/2016 0520   PHURINE 8.0 03/01/2016 0520   GLUCOSEU NEGATIVE 03/01/2016 0520   HGBUR NEGATIVE 03/01/2016 0520   BILIRUBINUR NEGATIVE 03/01/2016 0520   BILIRUBINUR Negative 12/28/2014 1606   KETONESUR 5 (A) 03/01/2016 0520   PROTEINUR NEGATIVE 03/01/2016 0520   NITRITE NEGATIVE 03/01/2016 0520   LEUKOCYTESUR NEGATIVE 03/01/2016 0520   LEUKOCYTESUR Negative 12/28/2014 1606    Pertinent Imaging: CT Stone study  Assessment & Plan:   1. Right ureteral calculus: -We discussed MET versus ESWL versus URS and the patient elects for MET.  -rx for flomax, percocet given -RTC 2 weeks with renal US and KUB  There are no diagnoses linked to this encounter.  No Follow-up on file.  Nicolette Bang, MD  [facility] [address], [place], [place] [address] [phone]

## 2016-03-06 ENCOUNTER — Telehealth: Payer: Self-pay | Admitting: Radiology

## 2016-03-06 NOTE — Telephone Encounter (Signed)
LMOM. Pt's wife, Deloris, called previously stating pt would like to proceed with surgery. Need to discuss this.

## 2016-03-07 ENCOUNTER — Other Ambulatory Visit: Payer: Self-pay | Admitting: Radiology

## 2016-03-07 ENCOUNTER — Other Ambulatory Visit: Payer: Self-pay

## 2016-03-07 ENCOUNTER — Other Ambulatory Visit: Payer: Medicare Other

## 2016-03-07 DIAGNOSIS — N200 Calculus of kidney: Secondary | ICD-10-CM

## 2016-03-07 DIAGNOSIS — N201 Calculus of ureter: Secondary | ICD-10-CM

## 2016-03-07 LAB — URINALYSIS, COMPLETE
BILIRUBIN UA: NEGATIVE
Glucose, UA: NEGATIVE
Ketones, UA: NEGATIVE
LEUKOCYTES UA: NEGATIVE
Nitrite, UA: NEGATIVE
PH UA: 7 (ref 5.0–7.5)
PROTEIN UA: NEGATIVE
Specific Gravity, UA: 1.02 (ref 1.005–1.030)
Urobilinogen, Ur: 1 mg/dL (ref 0.2–1.0)

## 2016-03-07 LAB — MICROSCOPIC EXAMINATION: BACTERIA UA: NONE SEEN

## 2016-03-07 NOTE — Telephone Encounter (Signed)
Notified pt of surgery scheduled with Dr Sherryl BartersBudzyn on 03/13/16, pre-admit testing appt on 03/11/16 @11 :30 & to call day prior to surgery for arrival time to [facility]. Pt voices understanding.

## 2016-03-10 LAB — CULTURE, URINE COMPREHENSIVE

## 2016-03-11 ENCOUNTER — Encounter
Admission: RE | Admit: 2016-03-11 | Discharge: 2016-03-11 | Disposition: A | Discharge status: Home / Self Care | Payer: Medicare Other | Source: Ambulatory Visit | Attending: Urology | Admitting: Urology

## 2016-03-11 DIAGNOSIS — N132 Hydronephrosis with renal and ureteral calculous obstruction: Secondary | ICD-10-CM | POA: Diagnosis not present

## 2016-03-11 DIAGNOSIS — N202 Calculus of kidney with calculus of ureter: Secondary | ICD-10-CM | POA: Diagnosis present

## 2016-03-11 DIAGNOSIS — K219 Gastro-esophageal reflux disease without esophagitis: Secondary | ICD-10-CM | POA: Diagnosis not present

## 2016-03-11 DIAGNOSIS — Z87891 Personal history of nicotine dependence: Secondary | ICD-10-CM | POA: Diagnosis not present

## 2016-03-11 HISTORY — DX: Personal history of urinary calculi: Z87.442

## 2016-03-11 NOTE — Patient Instructions (Signed)
Your procedure is scheduled on: THURSDAY 03/13/16 Report to DAY SURGERY. 2ND FLOOR MEDICAL MALL ENTRANCE. To find out your arrival time please call [phone] between 1PM - 3PM on Wednesday 03/12/16.  Remember: Instructions that are not followed completely may result in serious medical risk, up to and including death, or upon the discretion of your surgeon and anesthesiologist your surgery may need to be rescheduled.    __X__ 1. Do not eat food or drink liquids after midnight. No gum chewing or hard candies.     __X__ 2. No Alcohol for 24 hours before or after surgery.   ____ 3. Bring all medications with you on the day of surgery if instructed.    __X__ 4. Notify your doctor if there is any change in your medical condition     (cold, fever, infections).             __X___5. No smoking within 24 hours of your surgery.     Do not wear jewelry, make-up, hairpins, clips or nail polish.  Do not wear lotions, powders, or perfumes.   Do not shave 48 hours prior to surgery. Men may shave face and neck.  Do not bring valuables to the hospital.    [facility] is [facility] for any belongings or valuables.               Contacts, dentures or bridgework may not be worn into surgery.  Leave your suitcase in the car. After surgery it may be brought to your room.  For patients admitted to the hospital, discharge time is determined by your                treatment team.   Patients discharged the day of surgery will not be allowed to drive home.   Please read over the following fact sheets that you were given:   Pain Booklet and MRSA Information   __X__ Take these medicines the morning of surgery with A SIP OF WATER:    1. OMEPRAZOLE  2. TAMSULOSIN  3. OXYCODONE IF NEEDED  4.  5.  6.  ____ Fleet Enema (as directed)   ____ Use CHG Soap as directed  ____ Use inhalers on the day of surgery  ____ Stop metformin 2 days prior to surgery    ____ Take 1/2 of usual insulin dose the  night before surgery and none on the morning of surgery.   ____ Stop Coumadin/Plavix/aspirin on   __X__ Stop Anti-inflammatories such as Advil, Aleve, Ibuprofen, Motrin, Naproxen, Naprosyn, Goodies,powder, or aspirin products.  OK to take Tylenol.   ____ Stop supplements until after surgery.    ____ Bring C-Pap to the hospital.

## 2016-03-12 MED ORDER — CEFAZOLIN SODIUM-DEXTROSE 2-4 GM/100ML-% IV SOLN
2.0000 g | INTRAVENOUS | Status: AC
  Administered 2016-03-13: 2 g via INTRAVENOUS

## 2016-03-13 ENCOUNTER — Ambulatory Visit
Admission: RE | Admit: 2016-03-13 | Discharge: 2016-03-13 | Disposition: A | Discharge status: Home / Self Care | Payer: Medicare Other | Source: Ambulatory Visit | Attending: Urology | Admitting: Urology

## 2016-03-13 ENCOUNTER — Encounter: Payer: Self-pay | Admitting: *Deleted

## 2016-03-13 ENCOUNTER — Encounter
Admission: RE | Disposition: A | Discharge status: Home / Self Care | Payer: Self-pay | Source: Ambulatory Visit | Attending: Urology

## 2016-03-13 ENCOUNTER — Ambulatory Visit: Payer: Medicare Other | Admitting: Anesthesiology

## 2016-03-13 DIAGNOSIS — N201 Calculus of ureter: Secondary | ICD-10-CM

## 2016-03-13 DIAGNOSIS — N132 Hydronephrosis with renal and ureteral calculous obstruction: Secondary | ICD-10-CM | POA: Diagnosis not present

## 2016-03-13 DIAGNOSIS — Z87891 Personal history of nicotine dependence: Secondary | ICD-10-CM | POA: Insufficient documentation

## 2016-03-13 DIAGNOSIS — K219 Gastro-esophageal reflux disease without esophagitis: Secondary | ICD-10-CM | POA: Insufficient documentation

## 2016-03-13 DIAGNOSIS — N202 Calculus of kidney with calculus of ureter: Secondary | ICD-10-CM

## 2016-03-13 HISTORY — PX: CYSTOSCOPY WITH STENT PLACEMENT: SHX5790

## 2016-03-13 HISTORY — PX: URETEROSCOPY: SHX842

## 2016-03-13 SURGERY — URETEROSCOPY
Anesthesia: General | Site: Ureter | Laterality: Right | Wound class: Clean Contaminated

## 2016-03-13 MED ORDER — MIDAZOLAM HCL 5 MG/5ML IJ SOLN
INTRAMUSCULAR | Status: DC | PRN
  Administered 2016-03-13: 2 mg via INTRAVENOUS

## 2016-03-13 MED ORDER — LIDOCAINE HCL (CARDIAC) 20 MG/ML IV SOLN
INTRAVENOUS | Status: DC | PRN
Start: 2016-03-13 — End: 2016-03-13
  Administered 2016-03-13: 60 mg via INTRAVENOUS

## 2016-03-13 MED ORDER — CEPHALEXIN 500 MG PO CAPS: 500.0000 mg | ORAL_CAPSULE | Freq: Four times a day (QID) | ORAL | 0 refills | Status: DC

## 2016-03-13 MED ORDER — ONDANSETRON HCL 4 MG/2ML IJ SOLN
INTRAMUSCULAR | Status: DC | PRN
  Administered 2016-03-13: 4 mg via INTRAVENOUS

## 2016-03-13 MED ORDER — FENTANYL CITRATE (PF) 100 MCG/2ML IJ SOLN
INTRAMUSCULAR | Status: DC | PRN
  Administered 2016-03-13: 100 ug via INTRAVENOUS

## 2016-03-13 MED ORDER — IOTHALAMATE MEGLUMINE 43 % IV SOLN
INTRAVENOUS | Status: DC | PRN
  Administered 2016-03-13: 15 mL

## 2016-03-13 MED ORDER — SUGAMMADEX SODIUM 200 MG/2ML IV SOLN
INTRAVENOUS | Status: AC
  Filled 2016-03-13: qty 2

## 2016-03-13 MED ORDER — ONDANSETRON HCL 4 MG/2ML IJ SOLN
INTRAMUSCULAR | Status: AC
  Filled 2016-03-13: qty 2

## 2016-03-13 MED ORDER — FENTANYL CITRATE (PF) 100 MCG/2ML IJ SOLN
INTRAMUSCULAR | Status: AC
  Filled 2016-03-13: qty 2

## 2016-03-13 MED ORDER — SUGAMMADEX SODIUM 200 MG/2ML IV SOLN
INTRAVENOUS | Status: DC | PRN
  Administered 2016-03-13: 140 mg via INTRAVENOUS

## 2016-03-13 MED ORDER — LIDOCAINE 2% (20 MG/ML) 5 ML SYRINGE
INTRAMUSCULAR | Status: AC
  Filled 2016-03-13: qty 5

## 2016-03-13 MED ORDER — OXYCODONE-ACETAMINOPHEN 5-325 MG PO TABS: 1.0000 | ORAL_TABLET | ORAL | 0 refills | Status: DC | PRN

## 2016-03-13 MED ORDER — PROPOFOL 10 MG/ML IV BOLUS
INTRAVENOUS | Status: AC
  Filled 2016-03-13: qty 20

## 2016-03-13 MED ORDER — LACTATED RINGERS IV SOLN
INTRAVENOUS | Status: DC | PRN
  Administered 2016-03-13: 15:00:00 via INTRAVENOUS

## 2016-03-13 MED ORDER — DEXAMETHASONE SODIUM PHOSPHATE 10 MG/ML IJ SOLN
INTRAMUSCULAR | Status: DC | PRN
  Administered 2016-03-13: 5 mg via INTRAVENOUS

## 2016-03-13 MED ORDER — ROCURONIUM BROMIDE 100 MG/10ML IV SOLN
INTRAVENOUS | Status: DC | PRN
  Administered 2016-03-13: 10 mg via INTRAVENOUS
  Administered 2016-03-13: 120 mg via INTRAVENOUS

## 2016-03-13 MED ORDER — ONDANSETRON HCL 4 MG/2ML IJ SOLN: 4.0000 mg | Freq: Once | INTRAMUSCULAR | Status: DC | PRN

## 2016-03-13 MED ORDER — MIDAZOLAM HCL 2 MG/2ML IJ SOLN
INTRAMUSCULAR | Status: AC
  Filled 2016-03-13: qty 2

## 2016-03-13 MED ORDER — PROPOFOL 10 MG/ML IV BOLUS
INTRAVENOUS | Status: DC | PRN
  Administered 2016-03-13: 170 mg via INTRAVENOUS

## 2016-03-13 MED ORDER — FENTANYL CITRATE (PF) 100 MCG/2ML IJ SOLN: 25.0000 ug | INTRAMUSCULAR | Status: DC | PRN

## 2016-03-13 MED ORDER — DEXAMETHASONE SODIUM PHOSPHATE 10 MG/ML IJ SOLN
INTRAMUSCULAR | Status: AC
  Filled 2016-03-13: qty 1

## 2016-03-13 MED ORDER — CEFAZOLIN SODIUM-DEXTROSE 2-4 GM/100ML-% IV SOLN
INTRAVENOUS | Status: AC
  Filled 2016-03-13: qty 100

## 2016-03-13 SURGICAL SUPPLY — 28 items
BACTOSHIELD CHG 4% 4OZ (MISCELLANEOUS) ×2
BASKET ZERO TIP 1.9FR (BASKET) IMPLANT
CATH URETL 5X70 OPEN END (CATHETERS) ×4 IMPLANT
CNTNR SPEC 2.5X3XGRAD LEK (MISCELLANEOUS) ×2
CONT SPEC 4OZ STER OR WHT (MISCELLANEOUS) ×2
CONTAINER SPEC 2.5X3XGRAD LEK (MISCELLANEOUS) ×2 IMPLANT
FIBER LASER LITHO 273 (Laser) IMPLANT
GLOVE BIO SURGEON STRL SZ7 (GLOVE) ×4 IMPLANT
GLOVE BIO SURGEON STRL SZ7.5 (GLOVE) ×4 IMPLANT
GOWN STRL REUS W/ TWL LRG LVL4 (GOWN DISPOSABLE) ×2 IMPLANT
GOWN STRL REUS W/TWL LRG LVL4 (GOWN DISPOSABLE) ×2
GOWN STRL REUS W/TWL XL LVL3 (GOWN DISPOSABLE) ×4 IMPLANT
GUIDEWIRE SUPER STIFF (WIRE) IMPLANT
INTRODUCER DILATOR DOUBLE (INTRODUCER) ×4 IMPLANT
KIT RM TURNOVER CYSTO AR (KITS) ×4 IMPLANT
PACK CYSTO AR (MISCELLANEOUS) ×4 IMPLANT
SCRUB CHG 4% DYNA-HEX 4OZ (MISCELLANEOUS) ×2 IMPLANT
SENSORWIRE 0.038 NOT ANGLED (WIRE) ×4
SET CYSTO W/LG BORE CLAMP LF (SET/KITS/TRAYS/PACK) ×4 IMPLANT
SHEATH URETERAL 13/15X36 1L (SHEATH) ×4 IMPLANT
SOL .9 NS 3000ML IRR  AL (IV SOLUTION) ×2
SOL .9 NS 3000ML IRR UROMATIC (IV SOLUTION) ×2 IMPLANT
STENT URET 6FRX24 CONTOUR (STENTS) IMPLANT
STENT URET 6FRX26 CONTOUR (STENTS) ×4 IMPLANT
SURGILUBE 2OZ TUBE FLIPTOP (MISCELLANEOUS) ×4 IMPLANT
SYRINGE IRR TOOMEY STRL 70CC (SYRINGE) ×4 IMPLANT
WATER STERILE IRR 1000ML POUR (IV SOLUTION) ×4 IMPLANT
WIRE SENSOR 0.038 NOT ANGLED (WIRE) ×2 IMPLANT

## 2016-03-13 NOTE — Anesthesia Procedure Notes (Signed)
Procedure Name: Intubation Date/Time: 03/13/2016 3:09 PM Performed by: Lily KocherPERALTA, Kristilyn Coltrane Pre-anesthesia Checklist: Patient identified, Patient being monitored, Timeout performed, Emergency Drugs available and Suction available Patient Re-evaluated:Patient Re-evaluated prior to inductionOxygen Delivery Method: Circle system utilized Preoxygenation: Pre-oxygenation with 100% oxygen Intubation Type: IV induction Ventilation: Mask ventilation without difficulty Laryngoscope Size: Miller and 2 Grade View: Grade III Tube type: Oral Tube size: 7.5 mm Number of attempts: 1 Airway Equipment and Method: Bougie stylet Placement Confirmation: ETT inserted through vocal cords under direct vision,  positive ETCO2 and breath sounds checked- equal and bilateral Secured at: 23 cm Dental Injury: Teeth and Oropharynx as per pre-operative assessment

## 2016-03-13 NOTE — Anesthesia Preprocedure Evaluation (Signed)
Anesthesia Evaluation  Patient identified by MRN, date of birth, ID band Patient awake    Reviewed: Allergy & Precautions, H&P , NPO status , Patient's Chart, lab work & pertinent test results, reviewed documented beta blocker date and time   History of Anesthesia Complications Negative for: history of anesthetic complications  Airway Mallampati: III  TM Distance: <3 FB Neck ROM: full    Dental  (+) Teeth Intact   Pulmonary neg shortness of breath, Recent URI , Resolved, former smoker,    Pulmonary exam normal breath sounds clear to auscultation       Cardiovascular Exercise Tolerance: Good negative cardio ROS Normal cardiovascular exam Rhythm:regular Rate:Normal     Neuro/Psych negative neurological ROS  negative psych ROS   GI/Hepatic Neg liver ROS, GERD  ,  Endo/Other  negative endocrine ROS  Renal/GU Renal disease (kidney stones)  negative genitourinary   Musculoskeletal   Abdominal   Peds  Hematology negative hematology ROS (+)   Anesthesia Other Findings Past Medical History: 08/19/2013: Calculus of kidney No date: History of kidney stones No date: Nephrolithiasis No date: OA (osteoarthritis) 04/10/2014: Primary osteoarthritis of both knees   Reproductive/Obstetrics negative OB ROS                             Anesthesia Physical Anesthesia Plan  ASA: II  Anesthesia Plan: General ETT   Post-op Pain Management:    Induction:   Airway Management Planned:   Additional Equipment:   Intra-op Plan:   Post-operative Plan:   Informed Consent: I have reviewed the patients History and Physical, chart, labs and discussed the procedure including the risks, benefits and alternatives for the proposed anesthesia with the patient or authorized representative who has indicated his/her understanding and acceptance.   Dental Advisory Given  Plan Discussed with: Anesthesiologist, CRNA  and Surgeon  Anesthesia Plan Comments:         Anesthesia Quick Evaluation

## 2016-03-13 NOTE — H&P (Signed)
 @ENCDATE @ 2:40 PM   Danny Castro 01/04/1951 [id number]  Referring provider: No referring provider defined for this encounter.  No chief complaint on file.   HPI: The patient is a 65 year old gentleman with a right ureteral and right renal stone is elected to undergo surgical management via right ureteroscopy today. All risks benefits and indications have been discussed the patient great detail and he has consented to proceed. He understands the risks include bleeding, infection, need for repeat procedures as well as gastric injury.     PMH: Past Medical History:  Diagnosis Date  . Calculus of kidney 08/19/2013  . History of kidney stones   . Nephrolithiasis   . OA (osteoarthritis)   . Primary osteoarthritis of both knees 04/10/2014    Surgical History: Past Surgical History:  Procedure Laterality Date  . COLONOSCOPY    . EXTRACORPOREAL SHOCK WAVE LITHOTRIPSY Right 01/04/2015   Procedure: EXTRACORPOREAL SHOCK WAVE LITHOTRIPSY (ESWL);  Surgeon: Lorraine Laxichard D Hart, MD;  Location: [facility] ORS;  Service: Urology;  Laterality: Right;  . TONSILLECTOMY      Home Medications:    Allergies: No Known Allergies  Family History: Family History  Problem Relation Age of Onset  . Prostate cancer Father   . Nephrolithiasis Father     Social History:  reports that he quit smoking about 5 years ago. He has never used smokeless tobacco. He reports that he drinks alcohol. He reports that he does not use drugs.  ROS: 12 point ROS negative except for above                                        Physical Exam: BP (!) 141/76   Pulse 82   Temp 97.9 F (36.6 C) (Oral)   Resp 16   Ht 5\' 9"  (1.753 m)   Wt 161 lb (73 kg)   SpO2 100%   BMI 23.78 kg/m   Constitutional:  Alert and oriented, No acute distress. HEENT: [place] AT, moist mucus membranes.  Trachea midline, no masses. Cardiovascular: No clubbing, cyanosis, or edema. Respiratory: Normal respiratory effort, no  increased work of breathing. GI: Abdomen is soft, nontender, nondistended, no abdominal masses GU: No CVA tenderness.  Skin: No rashes, bruises or suspicious lesions. Lymph: No cervical or inguinal adenopathy. Neurologic: Grossly intact, no focal deficits, moving all 4 extremities. Psychiatric: Normal mood and affect.  Laboratory Data: Lab Results  Component Value Date   WBC 10.3 03/01/2016   HGB 15.4 03/01/2016   HCT 45.5 03/01/2016   MCV 89.9 03/01/2016   PLT 194 03/01/2016    Lab Results  Component Value Date   CREATININE 0.88 03/01/2016    No results found for: PSA  No results found for: TESTOSTERONE  No results found for: HGBA1C  Urinalysis    Component Value Date/Time   COLORURINE STRAW (A) 03/01/2016 0520   APPEARANCEUR Clear 03/07/2016 1430   LABSPEC 1.011 03/01/2016 0520   PHURINE 8.0 03/01/2016 0520   GLUCOSEU Negative 03/07/2016 1430   HGBUR NEGATIVE 03/01/2016 0520   BILIRUBINUR Negative 03/07/2016 1430   KETONESUR 5 (A) 03/01/2016 0520   PROTEINUR Negative 03/07/2016 1430   PROTEINUR NEGATIVE 03/01/2016 0520   NITRITE Negative 03/07/2016 1430   NITRITE NEGATIVE 03/01/2016 0520   LEUKOCYTESUR Negative 03/07/2016 1430    Assessment & Plan:    1. Nephrolithiasis Cystoscopy, right ureteroscopy, laser lithotripsy, right ureteral stent placement  Danny LaserBrian James Deni Lefever, MD  [facility] [address], [address] [place], [place] [address] [phone]

## 2016-03-13 NOTE — Discharge Instructions (Signed)

## 2016-03-13 NOTE — Transfer of Care (Signed)
Immediate Anesthesia Transfer of Care Note  Patient: Danny Castro  Procedure(s) Performed: Procedure(s): URETEROSCOPY (Right) CYSTOSCOPY WITH STENT PLACEMENT (Right)  Patient Location: PACU  Anesthesia Type:General  Level of Consciousness: awake and patient cooperative  Airway & Oxygen Therapy: Patient Spontanous Breathing and Patient connected to face mask oxygen  Post-op Assessment: Report given to RN and Post -op Vital signs reviewed and stable  Post vital signs: Reviewed and stable  Last Vitals:  Vitals:   03/13/16 1408 03/13/16 1541  BP: (!) 141/76 133/85  Pulse: 82 83  Resp: 16 17  Temp: 36.6 C 36.3 C    Last Pain:  Vitals:   03/13/16 1408  TempSrc: Oral         Complications: No apparent anesthesia complications

## 2016-03-13 NOTE — Op Note (Signed)
Date of procedure: 03/13/16  Preoperative diagnosis:  1. Right ureteral stone 2. Right renal stones   Postoperative diagnosis:  1. Same   Procedure: 1. Cystoscopy 2. Right retrograde pyelogram with interpretation 3. Right ureteral stent placement 6 French by 26 cm  Surgeon: Brian Budzyn, MD  Anesthesia: General  Complications: None  Intraoperative findings: Upon placement of the right sensor wire in the right ureter there is immediate drainage of debris and possibly purulent discharge. The incision that time was to abort ureteroscopy and to place a stent on an abundance of caution. The right retrograde polygrams that showed a filling defect in the mid right ureter as well as convoluted hydronephrotic ureter proximal to this filling defect.  EBL: None  Specimens: Urine culture  Drains: 6 French by 26 cm right double-J ureteral stent  Disposition: Stable to the postanesthesia care unit  Indication for procedure: The patient is a 65 y.o. male with a right ureteral stone as well as right nonobstructing stones presents today for surgical management.  After reviewing the management options for treatment, the patient elected to proceed with the above surgical procedure(s). We have discussed the potential benefits and risks of the procedure, side effects of the proposed treatment, the likelihood of the patient achieving the goals of the procedure, and any potential problems that might occur during the procedure or recuperation. Informed consent has been obtained.  Description of procedure: The patient was met in the preoperative area. All risks, benefits, and indications of the procedure were described in great detail. The patient consented to the procedure. Preoperative antibiotics were given. The patient was taken to the operative theater. General anesthesia was induced per the anesthesia service. The patient was then placed in the dorsal lithotomy position and prepped and draped in the  usual sterile fashion. A preoperative timeout was called.   A 21 French 30 cystoscope was inserted into the patient's bladder per urethra atraumatically. The right ureteral orifice was visualized and a sensor wire was advanced the level of the right renal pelvis and fluoroscopy. Medially upon passing the stone in the mid ureter there was reflux of debris and purulent looking material from the right ureter. At this point it was decided not to attempt ureteroscopy at an abundance of caution due to potential underlying infection. With the aid of a dual-lumen catheter, avery low pressure right retrograde polygrams obtained to identify the collecting system. The results of that are above. A 6 French by 26 cm double-J ureteral stent was then placed over the sensor wire and the sensor wire was then removed. A curl seen in the patient's right renal pelvis on fluoroscopy and the urinary bladder under direct visualization. The patient's bladder was then drained. His woken from anesthesia and transferred stable condition to postanesthesia care unit.  Plan:  The patient will be started on antibiotics. He'll follow-up in 2 weeks after negative urine culture for definitive stone management.  Brian Budzyn, M.D.   

## 2016-03-14 ENCOUNTER — Other Ambulatory Visit: Payer: Self-pay | Admitting: Radiology

## 2016-03-14 ENCOUNTER — Encounter: Payer: Self-pay | Admitting: Urology

## 2016-03-14 ENCOUNTER — Telehealth: Payer: Self-pay | Admitting: Radiology

## 2016-03-14 DIAGNOSIS — N201 Calculus of ureter: Secondary | ICD-10-CM

## 2016-03-14 NOTE — Telephone Encounter (Signed)
-----   Message from Hildred LaserBrian James Budzyn, MD sent at 03/13/2016  3:39 PM EST ----- Patient had purulent looking drainage from ureter. Only placed stent today. Will need repeat surgery in 2 weeks after negative urine culture. Thanks.

## 2016-03-14 NOTE — Telephone Encounter (Signed)
Notified pt of surgery scheduled with Dr Sherryl BartersBudzyn on 04/04/16, that pt will need to arrive to pre-admit testing at 7:30 for registration prior to going to [facility]. Advised pt to RTC for repeat ucx on 03/27/16 @3 :45. Pt voices understanding.

## 2016-03-15 LAB — URINE CULTURE: CULTURE: NO GROWTH

## 2016-03-18 ENCOUNTER — Ambulatory Visit: Payer: Medicare Other

## 2016-03-18 NOTE — Anesthesia Postprocedure Evaluation (Signed)
Anesthesia Post Note  Patient: Danny Castro  Procedure(s) Performed: Procedure(s) (LRB): URETEROSCOPY (Right) CYSTOSCOPY WITH STENT PLACEMENT (Right)  Patient location during evaluation: PACU Anesthesia Type: General Level of consciousness: awake and alert Pain management: pain level controlled Vital Signs Assessment: post-procedure vital signs reviewed and stable Respiratory status: spontaneous breathing, nonlabored ventilation, respiratory function stable and patient connected to nasal cannula oxygen Cardiovascular status: blood pressure returned to baseline and stable Postop Assessment: no signs of nausea or vomiting Anesthetic complications: no     Last Vitals:  Vitals:   03/13/16 1657 03/13/16 1708  BP: (!) 161/99 (!) 133/95  Pulse: 76 70  Resp: 16 16  Temp:      Last Pain:  Vitals:   03/13/16 1634  TempSrc: Temporal  PainSc:                  Lenard SimmerAndrew Beya Tipps

## 2016-03-25 ENCOUNTER — Ambulatory Visit: Payer: Medicare Other | Admitting: Urology

## 2016-03-27 ENCOUNTER — Other Ambulatory Visit: Payer: Self-pay

## 2016-03-27 ENCOUNTER — Other Ambulatory Visit: Payer: Medicare Other

## 2016-03-27 DIAGNOSIS — N200 Calculus of kidney: Secondary | ICD-10-CM

## 2016-03-27 LAB — URINALYSIS, COMPLETE
BILIRUBIN UA: NEGATIVE
Glucose, UA: NEGATIVE
KETONES UA: NEGATIVE
NITRITE UA: POSITIVE — AB
PH UA: 6 (ref 5.0–7.5)
Specific Gravity, UA: >1.03 — ABNORMAL HIGH (ref 1.005–1.030)
UUROB: 1 mg/dL (ref 0.2–1.0)

## 2016-03-27 LAB — MICROSCOPIC EXAMINATION: RBC, UA: >30 /hpf — AB (ref 0–?)

## 2016-03-28 MED ORDER — CIPROFLOXACIN HCL 500 MG PO TABS: 500.0000 mg | ORAL_TABLET | Freq: Two times a day (BID) | ORAL | 0 refills | Status: DC

## 2016-03-28 NOTE — Progress Notes (Signed)
cipro

## 2016-03-30 LAB — CULTURE, URINE COMPREHENSIVE

## 2016-03-31 NOTE — Telephone Encounter (Signed)
Notified pt of negative ucx. Per Dr Sherryl BartersBudzyn, pt does not need to take Cipro that was ordered on 03/28/16 for suspicious urinalysis. Advised pt to arrive at pre-admit testing office at 7:30 on 04/04/16 for registration prior to surgery. Pt voices understanding.

## 2016-04-04 ENCOUNTER — Ambulatory Visit: Payer: Medicare Other | Admitting: Registered Nurse

## 2016-04-04 ENCOUNTER — Telehealth: Payer: Self-pay | Admitting: Urology

## 2016-04-04 ENCOUNTER — Encounter
Admission: RE | Disposition: A | Discharge status: Home / Self Care | Payer: Self-pay | Source: Ambulatory Visit | Attending: Urology

## 2016-04-04 ENCOUNTER — Ambulatory Visit
Admission: RE | Admit: 2016-04-04 | Discharge: 2016-04-04 | Disposition: A | Discharge status: Home / Self Care | Payer: Medicare Other | Source: Ambulatory Visit | Attending: Urology | Admitting: Urology

## 2016-04-04 ENCOUNTER — Encounter: Payer: Self-pay | Admitting: *Deleted

## 2016-04-04 DIAGNOSIS — Z87891 Personal history of nicotine dependence: Secondary | ICD-10-CM | POA: Diagnosis not present

## 2016-04-04 DIAGNOSIS — M170 Bilateral primary osteoarthritis of knee: Secondary | ICD-10-CM | POA: Diagnosis not present

## 2016-04-04 DIAGNOSIS — N201 Calculus of ureter: Secondary | ICD-10-CM

## 2016-04-04 DIAGNOSIS — Z87442 Personal history of urinary calculi: Secondary | ICD-10-CM | POA: Diagnosis not present

## 2016-04-04 DIAGNOSIS — N202 Calculus of kidney with calculus of ureter: Secondary | ICD-10-CM | POA: Insufficient documentation

## 2016-04-04 DIAGNOSIS — N200 Calculus of kidney: Secondary | ICD-10-CM

## 2016-04-04 HISTORY — PX: URETEROSCOPY WITH HOLMIUM LASER LITHOTRIPSY: SHX6645

## 2016-04-04 HISTORY — PX: CYSTOSCOPY W/ URETERAL STENT PLACEMENT: SHX1429

## 2016-04-04 SURGERY — URETEROSCOPY, WITH LITHOTRIPSY USING HOLMIUM LASER
Anesthesia: General | Site: Ureter | Laterality: Right | Wound class: Clean Contaminated

## 2016-04-04 MED ORDER — PROPOFOL 10 MG/ML IV BOLUS
INTRAVENOUS | Status: DC | PRN
  Administered 2016-04-04: 150 mg via INTRAVENOUS

## 2016-04-04 MED ORDER — SUCCINYLCHOLINE CHLORIDE 200 MG/10ML IV SOSY
PREFILLED_SYRINGE | INTRAVENOUS | Status: AC
  Filled 2016-04-04: qty 10

## 2016-04-04 MED ORDER — LIDOCAINE 2% (20 MG/ML) 5 ML SYRINGE
INTRAMUSCULAR | Status: AC
  Filled 2016-04-04: qty 5

## 2016-04-04 MED ORDER — FENTANYL CITRATE (PF) 100 MCG/2ML IJ SOLN: 25.0000 ug | INTRAMUSCULAR | Status: DC | PRN

## 2016-04-04 MED ORDER — LACTATED RINGERS IV SOLN
INTRAVENOUS | Status: DC
  Administered 2016-04-04: 09:00:00 via INTRAVENOUS

## 2016-04-04 MED ORDER — LIDOCAINE HCL (CARDIAC) 20 MG/ML IV SOLN
INTRAVENOUS | Status: DC | PRN
  Administered 2016-04-04: 80 mg via INTRAVENOUS

## 2016-04-04 MED ORDER — FENTANYL CITRATE (PF) 100 MCG/2ML IJ SOLN
INTRAMUSCULAR | Status: DC | PRN
  Administered 2016-04-04: 25 ug via INTRAVENOUS
  Administered 2016-04-04: 100 ug via INTRAVENOUS
  Administered 2016-04-04 (×3): 25 ug via INTRAVENOUS

## 2016-04-04 MED ORDER — FENTANYL CITRATE (PF) 100 MCG/2ML IJ SOLN
INTRAMUSCULAR | Status: AC
  Filled 2016-04-04: qty 2

## 2016-04-04 MED ORDER — DEXAMETHASONE SODIUM PHOSPHATE 10 MG/ML IJ SOLN
INTRAMUSCULAR | Status: AC
  Filled 2016-04-04: qty 1

## 2016-04-04 MED ORDER — PROPOFOL 10 MG/ML IV BOLUS
INTRAVENOUS | Status: AC
  Filled 2016-04-04: qty 20

## 2016-04-04 MED ORDER — MIDAZOLAM HCL 2 MG/2ML IJ SOLN
INTRAMUSCULAR | Status: AC
  Filled 2016-04-04: qty 2

## 2016-04-04 MED ORDER — IOTHALAMATE MEGLUMINE 43 % IV SOLN
INTRAVENOUS | Status: DC | PRN
  Administered 2016-04-04: 15 mL

## 2016-04-04 MED ORDER — SUGAMMADEX SODIUM 200 MG/2ML IV SOLN
INTRAVENOUS | Status: AC
  Filled 2016-04-04: qty 2

## 2016-04-04 MED ORDER — CEPHALEXIN 500 MG PO CAPS: 500.0000 mg | ORAL_CAPSULE | Freq: Three times a day (TID) | ORAL | 0 refills | Status: DC

## 2016-04-04 MED ORDER — ROCURONIUM BROMIDE 100 MG/10ML IV SOLN
INTRAVENOUS | Status: DC | PRN
  Administered 2016-04-04: 30 mg via INTRAVENOUS

## 2016-04-04 MED ORDER — ROCURONIUM BROMIDE 50 MG/5ML IV SOSY
PREFILLED_SYRINGE | INTRAVENOUS | Status: AC
  Filled 2016-04-04: qty 5

## 2016-04-04 MED ORDER — SUGAMMADEX SODIUM 200 MG/2ML IV SOLN
INTRAVENOUS | Status: DC | PRN
  Administered 2016-04-04: 175 mg via INTRAVENOUS

## 2016-04-04 MED ORDER — OXYCODONE-ACETAMINOPHEN 5-325 MG PO TABS: 1.0000 | ORAL_TABLET | ORAL | 0 refills | Status: DC | PRN

## 2016-04-04 MED ORDER — DEXAMETHASONE SODIUM PHOSPHATE 10 MG/ML IJ SOLN
INTRAMUSCULAR | Status: DC | PRN
  Administered 2016-04-04: 10 mg via INTRAVENOUS

## 2016-04-04 MED ORDER — ACETAMINOPHEN 10 MG/ML IV SOLN
INTRAVENOUS | Status: DC | PRN
  Administered 2016-04-04: 1000 mg via INTRAVENOUS

## 2016-04-04 MED ORDER — MIDAZOLAM HCL 2 MG/2ML IJ SOLN
INTRAMUSCULAR | Status: DC | PRN
  Administered 2016-04-04: 2 mg via INTRAVENOUS

## 2016-04-04 MED ORDER — ONDANSETRON HCL 4 MG/2ML IJ SOLN
INTRAMUSCULAR | Status: AC
  Filled 2016-04-04: qty 2

## 2016-04-04 MED ORDER — ONDANSETRON HCL 4 MG/2ML IJ SOLN
INTRAMUSCULAR | Status: DC | PRN
Start: 2016-04-04 — End: 2016-04-04
  Administered 2016-04-04: 4 mg via INTRAVENOUS

## 2016-04-04 MED ORDER — CEFAZOLIN SODIUM-DEXTROSE 2-4 GM/100ML-% IV SOLN
2.0000 g | INTRAVENOUS | Status: AC
  Administered 2016-04-04: 2 g via INTRAVENOUS

## 2016-04-04 MED ORDER — CEFAZOLIN SODIUM-DEXTROSE 2-4 GM/100ML-% IV SOLN
INTRAVENOUS | Status: AC
  Filled 2016-04-04: qty 100

## 2016-04-04 MED ORDER — ONDANSETRON HCL 4 MG/2ML IJ SOLN: 4.0000 mg | Freq: Once | INTRAMUSCULAR | Status: DC | PRN

## 2016-04-04 MED ORDER — ACETAMINOPHEN 10 MG/ML IV SOLN
INTRAVENOUS | Status: AC
  Filled 2016-04-04: qty 100

## 2016-04-04 SURGICAL SUPPLY — 28 items
BACTOSHIELD CHG 4% 4OZ (MISCELLANEOUS) ×2
BASKET ZERO TIP 1.9FR (BASKET) ×4 IMPLANT
CATH URETL 5X70 OPEN END (CATHETERS) ×4 IMPLANT
CNTNR SPEC 2.5X3XGRAD LEK (MISCELLANEOUS) ×2
CONT SPEC 4OZ STER OR WHT (MISCELLANEOUS) ×2
CONTAINER SPEC 2.5X3XGRAD LEK (MISCELLANEOUS) ×2 IMPLANT
FIBER LASER LITHO 273 (Laser) ×4 IMPLANT
GLOVE BIO SURGEON STRL SZ7 (GLOVE) ×4 IMPLANT
GLOVE BIO SURGEON STRL SZ7.5 (GLOVE) ×4 IMPLANT
GOWN STRL REUS W/ TWL LRG LVL4 (GOWN DISPOSABLE) ×2 IMPLANT
GOWN STRL REUS W/TWL LRG LVL4 (GOWN DISPOSABLE) ×2
GOWN STRL REUS W/TWL XL LVL3 (GOWN DISPOSABLE) ×4 IMPLANT
GUIDEWIRE SUPER STIFF (WIRE) IMPLANT
INTRODUCER DILATOR DOUBLE (INTRODUCER) ×4 IMPLANT
KIT RM TURNOVER CYSTO AR (KITS) ×4 IMPLANT
PACK CYSTO AR (MISCELLANEOUS) ×4 IMPLANT
SCRUB CHG 4% DYNA-HEX 4OZ (MISCELLANEOUS) ×2 IMPLANT
SENSORWIRE 0.038 NOT ANGLED (WIRE) ×8
SET CYSTO W/LG BORE CLAMP LF (SET/KITS/TRAYS/PACK) ×4 IMPLANT
SHEATH URETERAL 13/15X36 1L (SHEATH) ×4 IMPLANT
SOL .9 NS 3000ML IRR  AL (IV SOLUTION) ×2
SOL .9 NS 3000ML IRR UROMATIC (IV SOLUTION) ×2 IMPLANT
STENT URET 6FRX24 CONTOUR (STENTS) IMPLANT
STENT URET 6FRX26 CONTOUR (STENTS) IMPLANT
SURGILUBE 2OZ TUBE FLIPTOP (MISCELLANEOUS) ×4 IMPLANT
SYRINGE IRR TOOMEY STRL 70CC (SYRINGE) ×4 IMPLANT
WATER STERILE IRR 1000ML POUR (IV SOLUTION) ×4 IMPLANT
WIRE SENSOR 0.038 NOT ANGLED (WIRE) ×4 IMPLANT

## 2016-04-04 NOTE — Anesthesia Postprocedure Evaluation (Signed)
Anesthesia Post Note  Patient: Danny Castro  Procedure(s) Performed: Procedure(s) (LRB): URETEROSCOPY WITH HOLMIUM LASER LITHOTRIPSY (Right) CYSTOSCOPY WITH STENT REPLACEMENT (Right)  Patient location during evaluation: PACU Anesthesia Type: General Level of consciousness: awake and alert Pain management: pain level controlled Vital Signs Assessment: post-procedure vital signs reviewed and stable Respiratory status: spontaneous breathing, nonlabored ventilation, respiratory function stable and patient connected to nasal cannula oxygen Cardiovascular status: blood pressure returned to baseline and stable Postop Assessment: no signs of nausea or vomiting Anesthetic complications: no     Last Vitals:  Vitals:   04/04/16 1056 04/04/16 1150  BP: (!) 168/95 (!) 168/98  Pulse: 63 67  Resp: 16 16  Temp: (!) 35.9 C (!) 35.9 C    Last Pain:  Vitals:   04/04/16 1150  TempSrc: Oral  PainSc: 0-No pain                 Perina Salvaggio S

## 2016-04-04 NOTE — Anesthesia Procedure Notes (Signed)
Procedure Name: Intubation Date/Time: 04/04/2016 8:47 AM Performed by: Karoline CaldwellSTARR, Velinda Wrobel Pre-anesthesia Checklist: Patient identified, Emergency Drugs available, Suction available and Patient being monitored Patient Re-evaluated:Patient Re-evaluated prior to inductionOxygen Delivery Method: Circle system utilized Preoxygenation: Pre-oxygenation with 100% oxygen Intubation Type: IV induction Ventilation: Mask ventilation without difficulty Laryngoscope Size: McGraph and 3 Grade View: Grade I Tube type: Oral Tube size: 7.5 mm Number of attempts: 1 Airway Equipment and Method: Stylet and Video-laryngoscopy Placement Confirmation: ETT inserted through vocal cords under direct vision,  positive ETCO2 and breath sounds checked- equal and bilateral Secured at: 21 cm Tube secured with: Tape Dental Injury: Teeth and Oropharynx as per pre-operative assessment

## 2016-04-04 NOTE — Anesthesia Post-op Follow-up Note (Cosign Needed)
Anesthesia QCDR form completed.        

## 2016-04-04 NOTE — Discharge Instructions (Signed)

## 2016-04-04 NOTE — Telephone Encounter (Signed)
done

## 2016-04-04 NOTE — H&P (View-Only) (Signed)
 @ENCDATE @ 2:40 PM   Danny Castro An 01/04/1951 [id number]  Referring provider: No referring provider defined for this encounter.  No chief complaint on file.   HPI: The patient is a 66 year old gentleman with a right ureteral and right renal stone is elected to undergo surgical management via right ureteroscopy today. All risks benefits and indications have been discussed the patient great detail and he has consented to proceed. He understands the risks include bleeding, infection, need for repeat procedures as well as gastric injury.     PMH: Past Medical History:  Diagnosis Date  . Calculus of kidney 08/19/2013  . History of kidney stones   . Nephrolithiasis   . OA (osteoarthritis)   . Primary osteoarthritis of both knees 04/10/2014    Surgical History: Past Surgical History:  Procedure Laterality Date  . COLONOSCOPY    . EXTRACORPOREAL SHOCK WAVE LITHOTRIPSY Right 01/04/2015   Procedure: EXTRACORPOREAL SHOCK WAVE LITHOTRIPSY (ESWL);  Surgeon: Lorraine Laxichard Castro Hart, MD;  Location: [facility] ORS;  Service: Urology;  Laterality: Right;  . TONSILLECTOMY      Home Medications:    Allergies: No Known Allergies  Family History: Family History  Problem Relation Age of Onset  . Prostate cancer Father   . Nephrolithiasis Father     Social History:  reports that he quit smoking about 5 years ago. He has never used smokeless tobacco. He reports that he drinks alcohol. He reports that he does not use drugs.  ROS: 12 point ROS negative except for above                                        Physical Exam: BP (!) 141/76   Pulse 82   Temp 97.9 F (36.6 C) (Oral)   Resp 16   Ht 5\' 9"  (1.753 m)   Wt 161 lb (73 kg)   SpO2 100%   BMI 23.78 kg/m   Constitutional:  Alert and oriented, No acute distress. HEENT: [place] AT, moist mucus membranes.  Trachea midline, no masses. Cardiovascular: No clubbing, cyanosis, or edema. Respiratory: Normal respiratory effort, no  increased work of breathing. GI: Abdomen is soft, nontender, nondistended, no abdominal masses GU: No CVA tenderness.  Skin: No rashes, bruises or suspicious lesions. Lymph: No cervical or inguinal adenopathy. Neurologic: Grossly intact, no focal deficits, moving all 4 extremities. Psychiatric: Normal mood and affect.  Laboratory Data: Lab Results  Component Value Date   WBC 10.3 03/01/2016   HGB 15.4 03/01/2016   HCT 45.5 03/01/2016   MCV 89.9 03/01/2016   PLT 194 03/01/2016    Lab Results  Component Value Date   CREATININE 0.88 03/01/2016    No results found for: PSA  No results found for: TESTOSTERONE  No results found for: HGBA1C  Urinalysis    Component Value Date/Time   COLORURINE STRAW (A) 03/01/2016 0520   APPEARANCEUR Clear 03/07/2016 1430   LABSPEC 1.011 03/01/2016 0520   PHURINE 8.0 03/01/2016 0520   GLUCOSEU Negative 03/07/2016 1430   HGBUR NEGATIVE 03/01/2016 0520   BILIRUBINUR Negative 03/07/2016 1430   KETONESUR 5 (A) 03/01/2016 0520   PROTEINUR Negative 03/07/2016 1430   PROTEINUR NEGATIVE 03/01/2016 0520   NITRITE Negative 03/07/2016 1430   NITRITE NEGATIVE 03/01/2016 0520   LEUKOCYTESUR Negative 03/07/2016 1430    Assessment & Plan:    1. Nephrolithiasis Cystoscopy, right ureteroscopy, laser lithotripsy, right ureteral stent placement  Hildred LaserBrian James Jeshawn Melucci, MD  [facility] [address], [address] [place], [place] [address] [phone]

## 2016-04-04 NOTE — Anesthesia Preprocedure Evaluation (Signed)
Anesthesia Evaluation  Patient identified by MRN, date of birth, ID band Patient awake    Reviewed: Allergy & Precautions, NPO status , Patient's Chart, lab work & pertinent test results, reviewed documented beta blocker date and time   Airway Mallampati: II  TM Distance: >3 FB     Dental  (+) Chipped   Pulmonary former smoker,           Cardiovascular      Neuro/Psych    GI/Hepatic   Endo/Other    Renal/GU Renal InsufficiencyRenal disease     Musculoskeletal  (+) Arthritis ,   Abdominal   Peds  Hematology   Anesthesia Other Findings   Reproductive/Obstetrics                             Anesthesia Physical Anesthesia Plan  ASA: II  Anesthesia Plan: General   Post-op Pain Management:    Induction: Intravenous  Airway Management Planned: LMA  Additional Equipment:   Intra-op Plan:   Post-operative Plan:   Informed Consent: I have reviewed the patients History and Physical, chart, labs and discussed the procedure including the risks, benefits and alternatives for the proposed anesthesia with the patient or authorized representative who has indicated his/her understanding and acceptance.     Plan Discussed with: CRNA  Anesthesia Plan Comments:         Anesthesia Quick Evaluation

## 2016-04-04 NOTE — Interval H&P Note (Signed)
History and Physical Interval Note:  04/04/2016 8:21 AM  Danny Castro D Brusseau  has presented today for surgery, with the diagnosis of right ureteral stone  The various methods of treatment have been discussed with the patient and family. After consideration of risks, benefits and other options for treatment, the patient has consented to  Procedure(s): URETEROSCOPY WITH HOLMIUM LASER LITHOTRIPSY (Right) CYSTOSCOPY WITH STENT PLACEMENT (Right) as a surgical intervention .  The patient's history has been reviewed, patient examined, no change in status, stable for surgery.  I have reviewed the patient's chart and labs.  Questions were answered to the patient's satisfaction.    RRR Lungs clear  Patient presents for repeat right URS after stent placement and negative urine culture.  Danny Castro

## 2016-04-04 NOTE — Transfer of Care (Signed)
Immediate Anesthesia Transfer of Care Note  Patient: Derrek GuRickey D Lehnen  Procedure(s) Performed: Procedure(s): URETEROSCOPY WITH HOLMIUM LASER LITHOTRIPSY (Right) CYSTOSCOPY WITH STENT REPLACEMENT (Right)  Patient Location: PACU  Anesthesia Type:General  Level of Consciousness: awake and alert   Airway & Oxygen Therapy: Patient Spontanous Breathing and Patient connected to face mask oxygen  Post-op Assessment: Report given to RN and Post -op Vital signs reviewed and stable  Post vital signs: Reviewed and stable  Last Vitals:  Vitals:   04/04/16 0810 04/04/16 1005  BP: (!) 143/94 (!) 163/97  Pulse: 76 78  Resp: 18 14  Temp: (!) 36 C (!) 36 C    Last Pain:  Vitals:   04/04/16 0810  TempSrc: Tympanic         Complications: No apparent anesthesia complications

## 2016-04-04 NOTE — Op Note (Signed)
Date of procedure: 04/04/16  Preoperative diagnosis:       1.  Right ureteral stone      2.  Right renal stones  Postoperative diagnosis:  1. Same   Procedure: 1. Cystoscopy 2. Right ureteroscopy 3. Laser lithotripsy 4. Stone basketing 5. Right retrograde polygrams interpretation 6. Right ureteral stent exchange 6 [place] by 26 cm   Surgeon: Baruch Gouty, MD  Anesthesia: General  Complications: None  Intraoperative findings: The patient had a mid right ureteral stone which was broken into small fragments are removed. He had 3 significant stones in the right renal pelvis that were dusted. Right retrograde pyelogram at the end of the procedure showed no further filling defects.  EBL: None  Specimens: Right ureteral stone  Drains: 6 French by 26 cm right double-J ureteral stent  Disposition: Stable to the postanesthesia care unit  Indication for procedure: The patient is a 66 y.o. male with right ureteral stone and right renal stones as well as nonobstructing left renal stones to personally underwent right ureteral stent placement. At that time ureteroscopy was not performed due to concern for underlying infection. He presents today for definitive management after a negative urine culture.  After reviewing the management options for treatment, the patient elected to proceed with the above surgical procedure(s). We have discussed the potential benefits and risks of the procedure, side effects of the proposed treatment, the likelihood of the patient achieving the goals of the procedure, and any potential problems that might occur during the procedure or recuperation. Informed consent has been obtained.  Description of procedure: The patient was met in the preoperative area. All risks, benefits, and indications of the procedure were described in great detail. The patient consented to the procedure. Preoperative antibiotics were given. The patient was taken to the operative theater.  General anesthesia was induced per the anesthesia service. The patient was then placed in the dorsal lithotomy position and prepped and draped in the usual sterile fashion. A preoperative timeout was called.   A 21 French 30 cystoscope was inserted into the patient's bladder per urethra atraumatically. The right ureteral stent was grasped flexible graspers and brought to level of the urethral meatus. A sensor wire was exchanged through this and the stent removed. The sensor was confirmed to be at the level of the right renal pelvis under fluoroscopy. His previous proximal right ureteral stone was seen in the mid ureter on fluoroscopy. The semirigid ureteroscope was inserted into the patient's bladder and worked to the mid right ureter. The stone was encountered at this point was broken into small fragments and removed. The specimens were sent to pathology. There were no further stones within the right ureter. A second sensor wire was then placed with the help with the ureteroscope. Over one of the sensor wires a ureteral access sheath was placed under fluoroscopy atraumatically. The inner sheath and sensor wire removed. The flexor ureteroscope was advanced into the right collecting system. There are 3 stones all found in the right lower pole. These were moved to the right upper pole which were then dusted with laser lithotripsy into fragments less than 2 mm. Pan nephroscopy did reveal small stones in middle and lower pole diverticula that were on accessible by ureteroscopy. These were not further addressed due to the location. Pan nephroscopy did not reveal any stone for greater than 2 mm. There was significant amount of straining from eclectic system. Right retrograde pyelogram taken through the ureteroscope confirming this. The ureteral access sheath  removed under direct localization revealing no stone fragments or trauma. The cystoscope was removed reassembled over the sensor wire and a 6 [place] by 26 cm  double-J ureteral stent was placed. A sensor wire was removed. There is a curl seen in the right renal pelvis on fluoroscopy and in the urinary bladder direct visualization. This point, the patient's bladder was drained. His wound from anesthesia and transferred in stable condition to the post anesthesia care unit.  Plan: The patient will follow-up in one week for stent removal in the office via cystoscopy. He will need a renal ultrasound in 1 month to rule out iatrogenic hydronephrosis. Also needed to monitor 3 or 4 stones on his left side with the largest being approximately 4 mm.  Baruch Gouty, M.D.

## 2016-04-10 ENCOUNTER — Other Ambulatory Visit: Payer: Self-pay

## 2016-04-10 DIAGNOSIS — N200 Calculus of kidney: Secondary | ICD-10-CM

## 2016-04-10 LAB — STONE ANALYSIS
CA OXALATE, MONOHYDR.: 90 %
Ca phos cry stone ql IR: 10 %
Stone Weight KSTONE: 18.2 mg

## 2016-04-10 MED ORDER — TAMSULOSIN HCL 0.4 MG PO CAPS: 0.4000 mg | ORAL_CAPSULE | Freq: Every day | ORAL | 3 refills | Status: DC

## 2016-04-11 ENCOUNTER — Encounter: Payer: Self-pay | Admitting: Urology

## 2016-04-11 ENCOUNTER — Ambulatory Visit (INDEPENDENT_AMBULATORY_CARE_PROVIDER_SITE_OTHER): Payer: Medicare Other | Admitting: Urology

## 2016-04-11 VITALS — BP 118/72 | HR 81 | Ht 69.0 in | Wt 158.4 lb

## 2016-04-11 DIAGNOSIS — Z125 Encounter for screening for malignant neoplasm of prostate: Secondary | ICD-10-CM | POA: Diagnosis not present

## 2016-04-11 DIAGNOSIS — N200 Calculus of kidney: Secondary | ICD-10-CM | POA: Diagnosis not present

## 2016-04-11 DIAGNOSIS — N400 Enlarged prostate without lower urinary tract symptoms: Secondary | ICD-10-CM | POA: Diagnosis not present

## 2016-04-11 LAB — URINALYSIS, COMPLETE
BILIRUBIN UA: NEGATIVE
GLUCOSE, UA: NEGATIVE
Ketones, UA: NEGATIVE
Nitrite, UA: NEGATIVE
PH UA: 6 (ref 5.0–7.5)
Specific Gravity, UA: 1.025 (ref 1.005–1.030)
UUROB: 1 mg/dL (ref 0.2–1.0)

## 2016-04-11 LAB — MICROSCOPIC EXAMINATION
Bacteria, UA: NONE SEEN
Epithelial Cells (non renal): NONE SEEN /hpf (ref 0–10)

## 2016-04-11 MED ORDER — LIDOCAINE HCL 2 % EX GEL
1.0000 "application " | Freq: Once | CUTANEOUS | Status: AC
  Administered 2016-04-11: 1 via URETHRAL

## 2016-04-11 MED ORDER — CIPROFLOXACIN HCL 500 MG PO TABS
500.0000 mg | ORAL_TABLET | Freq: Once | ORAL | Status: AC
  Administered 2016-04-11: 500 mg via ORAL

## 2016-04-11 NOTE — Progress Notes (Signed)
04/11/2016 8:51 AM   Danny Castro Jul 09, 1950 [id number]  Referring provider: Rusty Aus, MD [facility] [place], [place] [address]  CC: Follow Up nephrolithiasis  HPI:  1. Recurrent Nephrolithiasis -  Pre 2018 - MET x 3, SWL x1 03/2016 - Rt ureteroscopy / stent to Rt side stone free. Also Lt 60m x 3 non-obstructing stones (observing)  2. Medical Stone Disease -  Eval 2018: BMP,PTH,Urate - pending / low-normal serum Ca (8.7); Composition - 90% CaOx / 10% CaPO4; 24 Hr Urines - pending    3 - Microscopic Hematuria - microhematuria noted by PCP 2017. Eval with CT and operative cysto with stones as per above likely source.   4. Prostate cancer screening - Pt's father with prostate cancer 2018 - PSA (today / pending)  Today "Danny Castro" is seen for cysto stent pull and f/u above after recent right ureteoscopy for ureteral and renal stones.    PMH: Past Medical History:  Diagnosis Date  . Calculus of kidney 08/19/2013  . History of kidney stones   . Nephrolithiasis   . OA (osteoarthritis)   . Primary osteoarthritis of both knees 04/10/2014    Surgical History: Past Surgical History:  Procedure Laterality Date  . COLONOSCOPY    . CYSTOSCOPY W/ URETERAL STENT PLACEMENT Right 04/04/2016   Procedure: CYSTOSCOPY WITH STENT REPLACEMENT;  Surgeon: BNickie Retort MD;  Location: [facility] ORS;  Service: Urology;  Laterality: Right;  . CYSTOSCOPY WITH STENT PLACEMENT Right 03/13/2016   Procedure: CYSTOSCOPY WITH STENT PLACEMENT;  Surgeon: BNickie Retort MD;  Location: [facility] ORS;  Service: Urology;  Laterality: Right;  . EXTRACORPOREAL SHOCK WAVE LITHOTRIPSY Right 01/04/2015   Procedure: EXTRACORPOREAL SHOCK WAVE LITHOTRIPSY (ESWL);  Surgeon: RCollier Flowers MD;  Location: [facility] ORS;  Service: Urology;  Laterality: Right;  . TONSILLECTOMY    . URETEROSCOPY Right 03/13/2016   Procedure: URETEROSCOPY;  Surgeon: BNickie Retort MD;  Location:  [facility] ORS;  Service: Urology;  Laterality: Right;  . URETEROSCOPY WITH HOLMIUM LASER LITHOTRIPSY Right 04/04/2016   Procedure: URETEROSCOPY WITH HOLMIUM LASER LITHOTRIPSY;  Surgeon: BNickie Retort MD;  Location: [facility] ORS;  Service: Urology;  Laterality: Right;    Home Medications:  Allergies as of 04/11/2016   No Known Allergies     Medication List       Accurate as of 04/11/16  8:51 AM. Always use your most recent med list.          cephALEXin 500 MG capsule Commonly known as:  KEFLEX Take 1 capsule (500 mg total) by mouth 3 (three) times daily.   naproxen sodium 220 MG tablet Commonly known as:  ANAPROX Take 440 mg by mouth 2 (two) times daily as needed (for pain.).   omeprazole 20 MG capsule Commonly known as:  PRILOSEC Take 20 mg by mouth daily as needed (for acid reflux/indigestion).   ondansetron 4 MG disintegrating tablet Commonly known as:  ZOFRAN ODT Take 1 tablet (4 mg total) by mouth every 8 (eight) hours as needed for nausea or vomiting.   ondansetron 4 MG tablet Commonly known as:  ZOFRAN Take 4 mg by mouth every 8 (eight) hours as needed for nausea/vomiting.   oxyCODONE-acetaminophen 5-325 MG tablet Commonly known as:  ROXICET Take 1 tablet by mouth every 4 (four) hours as needed for severe pain.   oxymetazoline 0.05 % nasal spray Commonly known as:  AFRIN Place 1 spray into both nostrils at bedtime as needed for  congestion.   Phenazopyridine HCl 97.5 MG Tabs Take 97.5 mg by mouth every 4 (four) hours as needed (burning).   tamsulosin 0.4 MG Caps capsule Commonly known as:  FLOMAX Take 1 capsule (0.4 mg total) by mouth daily.       Allergies: No Known Allergies  Family History: Family History  Problem Relation Age of Onset  . Prostate cancer Father   . Nephrolithiasis Father     Social History:  reports that he quit smoking about 5 years ago. He has never used smokeless tobacco. He reports that he drinks alcohol. He reports that he does  not use drugs.   Review of Systems  Gastrointestinal (upper)  : Negative for upper GI symptoms  Gastrointestinal (lower) : Negative for lower GI symptoms  Constitutional : Negative for symptoms  Skin: Negative for skin symptoms  Eyes: Negative for eye symptoms  Ear/Nose/Throat : Negative for Ear/Nose/Throat symptoms  Hematologic/Lymphatic: Negative for Hematologic/Lymphatic symptoms  Cardiovascular : Negative for cardiovascular symptoms  Respiratory : Negative for respiratory symptoms  Endocrine: Negative for endocrine symptoms  Musculoskeletal: Negative for musculoskeletal symptoms  Neurological: Negative for neurological symptoms  Psychologic: Negative for psychiatric symptoms   Physical Exam: There were no vitals taken for this visit.  Constitutional:  Alert and oriented, No acute distress. HEENT: [place] AT, moist mucus membranes.  Trachea midline, no masses. Cardiovascular: No clubbing, cyanosis, or edema. Respiratory: Normal respiratory effort, no increased work of breathing. GI: Abdomen is soft, nontender, nondistended, no abdominal masses GU: No CVA tenderness.  Skin: No rashes, bruises or suspicious lesions. Lymph: No cervical or inguinal adenopathy. Neurologic: Grossly intact, no focal deficits, moving all 4 extremities. Psychiatric: Normal mood and affect.  Laboratory Data: Lab Results  Component Value Date   WBC 10.3 03/01/2016   HGB 15.4 03/01/2016   HCT 45.5 03/01/2016   MCV 89.9 03/01/2016   PLT 194 03/01/2016    Lab Results  Component Value Date   CREATININE 0.88 03/01/2016     Pertinent Imaging: independently reviewed all studies referenced in HPI    04/11/16  CC:  Chief Complaint  Patient presents with  . Cysto Stent Removal    Nephrolithiasis    HPI:  Blood pressure 118/72, pulse 81, height 5' 9"  (1.753 m), weight 71.8 kg (158 lb 6.4 oz). NED. A&Ox3.   No respiratory distress   Abd soft, NT, ND Normal phallus  with bilateral descended testicles  Cystoscopy Procedure Note  Patient identification was confirmed, informed consent was obtained, and patient was prepped using Betadine solution.  Lidocaine jelly was administered per urethral meatus.    Preoperative abx where received prior to procedure.     Pre-Procedure: - Inspection reveals a normal caliber ureteral meatus.  Procedure: The flexible cystoscope was introduced without difficulty - No urethral strictures/lesions are present. - Enlarged prostate trilobar. - Normal bladder neck - Bilateral ureteral orifices identified - Bladder mucosa  reveals no ulcers, tumors, or lesions - No bladder stones - No trabeculation  Retroflexion shows no additional findings.  Ureteral stent grasped and removed in its entirity. Inspected and intact and discarded. Kellita as assist.    Post-Procedure: - Patient tolerated the procedure well  Assessment/ Plan:   Assessment & Plan:   1. Calculus of kidney - Rt side now stone free, left side non-obstructing. Rec annual surveillance imaging with KUB/RUS given prior recurrence pattern.   2. Medical Stone Disease - Low serum Ca and some phosphate composition raises suspicion of renal leak hypercalciuria. PTH, Urate,  PSA today. RX for 24 hr urines given.   3 - Microscopic Hematuria - from nephrolithiasis by eval 2018.   4. Prostate cancer screening - PSA today to complete screening this year.  RTC 52mo to discuss metabolic management of recurrent stones.     MAlexis Frock [facility] [address] [place] [phone]

## 2016-05-07 ENCOUNTER — Other Ambulatory Visit: Payer: Medicare Other

## 2016-06-09 ENCOUNTER — Telehealth: Payer: Self-pay | Admitting: Urology

## 2016-06-09 NOTE — Telephone Encounter (Signed)
Spoke with pt wife in reference to [place]. Made wife aware orders were faxed and confirmed the second time, but will fax orders again. Made wife aware if pt does not receive kit in the next week to let us know and will call [employer]. Wife voiced understanding.

## 2016-06-09 NOTE — Telephone Encounter (Signed)
Patient's wife called the office this morning to inquire about the 24-hr urine test collection kit.   Patient completed one test and it was rejected due to urine collection time requirements.  They were told that a new kit would be mailed to them, but they have not received it yet.  Please call Deloris at [phone].

## 2016-06-17 ENCOUNTER — Telehealth: Payer: Self-pay

## 2016-06-17 NOTE — Telephone Encounter (Signed)
Pt wife called stating they still have not received [employer] kit yet. Nurse called [employer] who stated a new box was shipped out yesterday and should arrive within 5 business days. Spoke with pt wife and made aware. Wife voiced understanding.

## 2018-07-06 ENCOUNTER — Encounter: Payer: Self-pay | Admitting: Emergency Medicine

## 2018-07-06 ENCOUNTER — Other Ambulatory Visit: Payer: Self-pay

## 2018-07-06 ENCOUNTER — Emergency Department
Admission: EM | Admit: 2018-07-06 | Discharge: 2018-07-06 | Disposition: A | Discharge status: Home / Self Care | Payer: Medicare Other | Attending: Emergency Medicine | Admitting: Emergency Medicine

## 2018-07-06 ENCOUNTER — Emergency Department: Payer: Medicare Other

## 2018-07-06 DIAGNOSIS — Z79899 Other long term (current) drug therapy: Secondary | ICD-10-CM | POA: Diagnosis not present

## 2018-07-06 DIAGNOSIS — Z87891 Personal history of nicotine dependence: Secondary | ICD-10-CM | POA: Diagnosis not present

## 2018-07-06 DIAGNOSIS — N201 Calculus of ureter: Secondary | ICD-10-CM

## 2018-07-06 DIAGNOSIS — R1032 Left lower quadrant pain: Secondary | ICD-10-CM | POA: Diagnosis present

## 2018-07-06 DIAGNOSIS — N132 Hydronephrosis with renal and ureteral calculous obstruction: Secondary | ICD-10-CM | POA: Diagnosis not present

## 2018-07-06 LAB — URINALYSIS, COMPLETE (UACMP) WITH MICROSCOPIC
Bacteria, UA: NONE SEEN
Bilirubin Urine: NEGATIVE
Glucose, UA: NEGATIVE mg/dL
Ketones, ur: NEGATIVE mg/dL
Leukocytes,Ua: NEGATIVE
Nitrite: NEGATIVE
Protein, ur: NEGATIVE mg/dL
RBC / HPF: >50 RBC/hpf — ABNORMAL HIGH (ref 0–5)
Specific Gravity, Urine: 1.013 (ref 1.005–1.030)
Squamous Epithelial / HPF: NONE SEEN (ref 0–5)
pH: 6 (ref 5.0–8.0)

## 2018-07-06 LAB — CBC WITH DIFFERENTIAL/PLATELET
Abs Immature Granulocytes: 0.07 10*3/uL (ref 0.00–0.07)
Basophils Absolute: 0.1 10*3/uL (ref 0.0–0.1)
Basophils Relative: 0 %
Eosinophils Absolute: 0 10*3/uL (ref 0.0–0.5)
Eosinophils Relative: 0 %
HCT: 44.1 % (ref 39.0–52.0)
Hemoglobin: 14.8 g/dL (ref 13.0–17.0)
Immature Granulocytes: 0 %
Lymphocytes Relative: 7 %
Lymphs Abs: 1.2 10*3/uL (ref 0.7–4.0)
MCH: 29.7 pg (ref 26.0–34.0)
MCHC: 33.6 g/dL (ref 30.0–36.0)
MCV: 88.6 fL (ref 80.0–100.0)
Monocytes Absolute: 0.7 10*3/uL (ref 0.1–1.0)
Monocytes Relative: 4 %
Neutro Abs: 14.6 10*3/uL — ABNORMAL HIGH (ref 1.7–7.7)
Neutrophils Relative %: 89 %
Platelets: 208 10*3/uL (ref 150–400)
RBC: 4.98 MIL/uL (ref 4.22–5.81)
RDW: 14.6 % (ref 11.5–15.5)
Smear Review: NORMAL
WBC: 16.6 10*3/uL — ABNORMAL HIGH (ref 4.0–10.5)
nRBC: 0 % (ref 0.0–0.2)

## 2018-07-06 LAB — BASIC METABOLIC PANEL
Anion gap: 9 (ref 5–15)
BUN: 16 mg/dL (ref 8–23)
CO2: 23 mmol/L (ref 22–32)
Calcium: 8.6 mg/dL — ABNORMAL LOW (ref 8.9–10.3)
Chloride: 105 mmol/L (ref 98–111)
Creatinine, Ser: 0.91 mg/dL (ref 0.61–1.24)
GFR calc Af Amer: >60 mL/min (ref >60)
GFR calc non Af Amer: >60 mL/min (ref >60)
Glucose, Bld: 113 mg/dL — ABNORMAL HIGH (ref 70–99)
Potassium: 3.5 mmol/L (ref 3.5–5.1)
Sodium: 137 mmol/L (ref 135–145)

## 2018-07-06 MED ORDER — ONDANSETRON 4 MG PO TBDP: 4.0000 mg | ORAL_TABLET | Freq: Three times a day (TID) | ORAL | 0 refills | Status: DC | PRN

## 2018-07-06 MED ORDER — TAMSULOSIN HCL 0.4 MG PO CAPS
0.4000 mg | ORAL_CAPSULE | ORAL | Status: AC
  Administered 2018-07-06: 16:00:00 0.4 mg via ORAL
  Filled 2018-07-06: qty 1

## 2018-07-06 MED ORDER — HYDROMORPHONE HCL 1 MG/ML IJ SOLN
1.0000 mg | Freq: Once | INTRAMUSCULAR | Status: AC
  Administered 2018-07-06: 14:00:00 1 mg via INTRAVENOUS
  Filled 2018-07-06: qty 1

## 2018-07-06 MED ORDER — SODIUM CHLORIDE 0.9 % IV BOLUS
1000.0000 mL | Freq: Once | INTRAVENOUS | Status: AC
  Administered 2018-07-06: 1000 mL via INTRAVENOUS

## 2018-07-06 MED ORDER — KETOROLAC TROMETHAMINE 30 MG/ML IJ SOLN
15.0000 mg | Freq: Once | INTRAMUSCULAR | Status: AC
  Administered 2018-07-06: 16:00:00 15 mg via INTRAVENOUS
  Filled 2018-07-06: qty 1

## 2018-07-06 MED ORDER — OXYCODONE-ACETAMINOPHEN 5-325 MG PO TABS: 1.0000 | ORAL_TABLET | Freq: Four times a day (QID) | ORAL | 0 refills | Status: DC | PRN

## 2018-07-06 MED ORDER — IBUPROFEN 200 MG PO TABS: 600.0000 mg | ORAL_TABLET | Freq: Four times a day (QID) | ORAL | 0 refills | Status: DC | PRN

## 2018-07-06 MED ORDER — ACETAMINOPHEN 500 MG PO TABS
1000.0000 mg | ORAL_TABLET | Freq: Once | ORAL | Status: AC
  Administered 2018-07-06: 1000 mg via ORAL
  Filled 2018-07-06: qty 2

## 2018-07-06 MED ORDER — KETOROLAC TROMETHAMINE 30 MG/ML IJ SOLN
15.0000 mg | Freq: Once | INTRAMUSCULAR | Status: AC
  Administered 2018-07-06: 14:00:00 15 mg via INTRAVENOUS
  Filled 2018-07-06: qty 1

## 2018-07-06 MED ORDER — TAMSULOSIN HCL 0.4 MG PO CAPS: 0.4000 mg | ORAL_CAPSULE | Freq: Every day | ORAL | 0 refills | Status: DC

## 2018-07-06 MED ORDER — ONDANSETRON HCL 4 MG/2ML IJ SOLN
4.0000 mg | Freq: Once | INTRAMUSCULAR | Status: AC
  Administered 2018-07-06: 4 mg via INTRAVENOUS
  Filled 2018-07-06: qty 2

## 2018-07-06 MED ORDER — OXYCODONE HCL 5 MG PO TABS
5.0000 mg | ORAL_TABLET | Freq: Once | ORAL | Status: AC
  Administered 2018-07-06: 5 mg via ORAL
  Filled 2018-07-06: qty 1

## 2018-07-06 NOTE — ED Triage Notes (Signed)
Pt presents to ED via POV with c/o L flank pain since 1030 this morning. Pt's wife reports took 1 oxycotin and 1 dilaudid pill PTA without relief. Pt is obviously uncomfortable upon arrival and is unable to sit still.

## 2018-07-06 NOTE — ED Provider Notes (Signed)
-----------------------------------------   5:05 PM on 07/06/2018 -----------------------------------------   Blood pressure (!) 184/113, pulse 92, temperature 97.7 F (36.5 C), temperature source Oral, resp. rate 18, height 5\' 9"  (1.753 m), weight 74.8 kg, SpO2 96 %.  Assuming care from Dr. Scotty Court of [facility]I is a 68 y.o. male with a chief complaint of Flank Pain .    Please refer to H&P by previous MD for further details.  The current plan of care is to follow UA to rule out infected stone.  UA is negative for infection.  Pain is well controlled.  Patient be discharged home with prescriptions left by Dr. Scotty Court with follow-up with urology        Danny Castro, [place], MD 07/06/18 [phone]

## 2018-07-06 NOTE — Discharge Instructions (Signed)
Your CT scan shows a 6 mm in the left ureter.  Take Flomax, pain medicine as needed, and drink lots of fluids to help pass the stone.  Call urology today or early tomorrow to follow-up.

## 2018-07-06 NOTE — ED Notes (Signed)
Pt states, "the only thing that works for my pain is dilaudid, I need a shot of dilaudid!"

## 2018-07-06 NOTE — ED Provider Notes (Signed)
[facility] Emergency Department Provider Note  ____________________________________________  Time seen: Approximately 2:53 PM  I have reviewed the triage vital signs and the nursing notes.   HISTORY  Chief Complaint Flank Pain    HPI Danny Castro is a 68 y.o. male with a history of kidney stones who complains of sudden onset of left flank pain radiating to the groin since 1030 this morning.  It is constant, severe, sharp, no aggravating or alleviating factors.  Associated nausea but no vomiting.  Took some leftover opioids he had at home from 2 years ago without relief.  Denies dysuria frequency urgency hematuria fevers or chills.      Past Medical History:  Diagnosis Date  . Calculus of kidney 08/19/2013  . History of kidney stones   . Nephrolithiasis   . OA (osteoarthritis)   . Primary osteoarthritis of both knees 04/10/2014     Patient Active Problem List   Diagnosis Date Noted  . Prostate cancer screening 04/11/2016  . Enlarged prostate on rectal examination 04/11/2016  . Primary osteoarthritis of both knees 04/10/2014  . Calculus of kidney 08/19/2013     Past Surgical History:  Procedure Laterality Date  . COLONOSCOPY    . CYSTOSCOPY W/ URETERAL STENT PLACEMENT Right 04/04/2016   Procedure: CYSTOSCOPY WITH STENT REPLACEMENT;  Surgeon: Hildred LaserBrian James Budzyn, MD;  Location: [facility] ORS;  Service: Urology;  Laterality: Right;  . CYSTOSCOPY WITH STENT PLACEMENT Right 03/13/2016   Procedure: CYSTOSCOPY WITH STENT PLACEMENT;  Surgeon: Hildred LaserBrian James Budzyn, MD;  Location: [facility] ORS;  Service: Urology;  Laterality: Right;  . EXTRACORPOREAL SHOCK WAVE LITHOTRIPSY Right 01/04/2015   Procedure: EXTRACORPOREAL SHOCK WAVE LITHOTRIPSY (ESWL);  Surgeon: Lorraine Laxichard D Hart, MD;  Location: [facility] ORS;  Service: Urology;  Laterality: Right;  . TONSILLECTOMY    . URETEROSCOPY Right 03/13/2016   Procedure: URETEROSCOPY;  Surgeon: Hildred LaserBrian James Budzyn, MD;  Location: [facility] ORS;   Service: Urology;  Laterality: Right;  . URETEROSCOPY WITH HOLMIUM LASER LITHOTRIPSY Right 04/04/2016   Procedure: URETEROSCOPY WITH HOLMIUM LASER LITHOTRIPSY;  Surgeon: Hildred LaserBrian James Budzyn, MD;  Location: [facility] ORS;  Service: Urology;  Laterality: Right;     Prior to Admission medications   Medication Sig Start Date End Date Taking? Authorizing Provider  cephALEXin (KEFLEX) 500 MG capsule Take 1 capsule (500 mg total) by mouth 3 (three) times daily. Patient not taking: Reported on 04/11/2016 04/04/16   Hildred LaserBudzyn, Brian James, MD  ibuprofen (MOTRIN IB) 200 MG tablet Take 3 tablets (600 mg total) by mouth every 6 (six) hours as needed. 07/06/18   Sharman CheekStafford, Adalynd Donahoe, MD  naproxen sodium (ANAPROX) 220 MG tablet Take 440 mg by mouth 2 (two) times daily as needed (for pain.).    [provider]  omeprazole (PRILOSEC) 20 MG capsule Take 20 mg by mouth daily as needed (for acid reflux/indigestion).     [provider]  ondansetron (ZOFRAN ODT) 4 MG disintegrating tablet Take 1 tablet (4 mg total) by mouth every 8 (eight) hours as needed for nausea or vomiting. Patient not taking: Reported on 04/11/2016 03/01/16   Darci CurrentBrown, Faxon N, MD  ondansetron (ZOFRAN ODT) 4 MG disintegrating tablet Take 1 tablet (4 mg total) by mouth every 8 (eight) hours as needed for nausea or vomiting. 07/06/18   Sharman CheekStafford, Jamontae Thwaites, MD  ondansetron (ZOFRAN) 4 MG tablet Take 4 mg by mouth every 8 (eight) hours as needed for nausea/vomiting. 03/01/16   [provider]  oxyCODONE-acetaminophen (PERCOCET) 5-325 MG tablet Take 1  tablet by mouth every 6 (six) hours as needed for severe pain. 07/06/18 07/06/19  Sharman Cheek, MD  oxymetazoline (AFRIN) 0.05 % nasal spray Place 1 spray into both nostrils at bedtime as needed for congestion.     [provider]  Phenazopyridine HCl 97.5 MG TABS Take 97.5 mg by mouth every 4 (four) hours as needed (burning).    [provider]  tamsulosin (FLOMAX) 0.4 MG  CAPS capsule Take 1 capsule (0.4 mg total) by mouth daily. 07/06/18   Sharman Cheek, MD     Allergies Prednisone   Family History  Problem Relation Age of Onset  . Prostate cancer Father   . Nephrolithiasis Father     Social History Social History   Tobacco Use  . Smoking status: Former Smoker    Last attempt to quit: 11/29/2010    Years since quitting: 7.6  . Smokeless tobacco: Never Used  Substance Use Topics  . Alcohol use: Yes    Alcohol/week: 0.0 standard drinks  . Drug use: No    Review of Systems  Constitutional:   No fever or chills.  ENT:   No sore throat. No rhinorrhea. Cardiovascular:   No chest pain or syncope. Respiratory:   No dyspnea or cough. Gastrointestinal:   Negative for abdominal pain, vomiting and diarrhea.  Musculoskeletal:   Positive left flank pain as above. All other systems reviewed and are negative except as documented above in ROS and HPI.  ____________________________________________   PHYSICAL EXAM:  VITAL SIGNS: ED Triage Vitals  Enc Vitals Group     BP 07/06/18 1325 (!) 208/124     Pulse Rate 07/06/18 1325 97     Resp 07/06/18 1325 20     Temp 07/06/18 1325 97.7 F (36.5 C)     Temp Source 07/06/18 1325 Oral     SpO2 07/06/18 1325 98 %     Weight 07/06/18 1323 165 lb (74.8 kg)     Height 07/06/18 1323  (1.753 m)     Head Circumference --      Peak Flow --      Pain Score 07/06/18 1323 10     Pain Loc --      Pain Edu? --      Excl. in GC? --     Vital signs reviewed, nursing assessments reviewed.   Constitutional:   Alert and oriented. Non-toxic appearance. Eyes:   Conjunctivae are normal. EOMI. PERRL. ENT      Head:   Normocephalic and atraumatic.      Nose:   No congestion/rhinnorhea.       Mouth/Throat:   MMM, no pharyngeal erythema. No peritonsillar mass.       Neck:   No meningismus. Full ROM. Hematological/Lymphatic/Immunilogical:   No cervical lymphadenopathy. Cardiovascular:   RRR. Symmetric  bilateral radial and DP pulses.  No murmurs. Cap refill less than 2 seconds. Respiratory:   Normal respiratory effort without tachypnea/retractions. Breath sounds are clear and equal bilaterally. No wheezes/rales/rhonchi. Gastrointestinal:   Soft and nontender. Non distended. There is no CVA tenderness.  No rebound, rigidity, or guarding. Musculoskeletal:   Normal range of motion in all extremities. No joint effusions.  No lower extremity tenderness.  No edema. Neurologic:   Normal speech and language.  Motor grossly intact. No acute focal neurologic deficits are appreciated.  Skin:    Skin is warm, dry and intact. No rash noted.  No petechiae, purpura, or bullae.  ____________________________________________    LABS (pertinent positives/negatives) (  all labs ordered are listed, but only abnormal results are displayed) Labs Reviewed  BASIC METABOLIC PANEL - Abnormal; Notable for the following components:      Result Value   Glucose, Bld 113 (*)    Calcium 8.6 (*)    All other components within normal limits  CBC WITH DIFFERENTIAL/PLATELET  URINALYSIS, COMPLETE (UACMP) WITH MICROSCOPIC   ____________________________________________   EKG    ____________________________________________    RADIOLOGY  Ct Renal Stone Study  Result Date: 07/06/2018 CLINICAL DATA:  68 year old male with history of left flank pain since 10/30 this morning. EXAM: CT ABDOMEN AND PELVIS WITHOUT CONTRAST TECHNIQUE: Multidetector CT imaging of the abdomen and pelvis was performed following the standard protocol without IV contrast. COMPARISON:  CT the abdomen and pelvis 03/01/2016. FINDINGS: Lower chest: Unremarkable. Hepatobiliary: No definite cystic or solid hepatic lesions are confidently identified on today's noncontrast CT examination. Unenhanced appearance of the gallbladder is normal. Pancreas: No definite pancreatic mass or peripancreatic fluid or inflammatory changes are noted on today's noncontrast  CT examination. Spleen: Splenic atrophy. Adrenals/Urinary Tract: Numerous nonobstructive calculi are noted within the collecting systems of both kidneys measuring up to 10 mm in the lower pole collecting system of the right kidney. In addition, in the proximal third of the left ureter shortly beyond the left ureteropelvic junction (coronal image 69 of series 8) there is a 6 mm calculus which is associated with mild proximal left hydroureteronephrosis. No additional calculi are noted along the course of the right ureter or within the lumen of the urinary bladder. No right-sided hydroureteronephrosis. Unenhanced appearance of the kidneys is otherwise unremarkable bilaterally. Urinary bladder is normal in appearance. Bilateral adrenal glands are normal in appearance. Stomach/Bowel: Unenhanced appearance of the stomach is normal. There is no pathologic dilatation of small bowel or colon. A few scattered colonic diverticulae are noted, most evident in the sigmoid colon, without surrounding inflammatory changes to suggest an acute diverticulitis at this time. Normal appendix. Vascular/Lymphatic: Aortic atherosclerosis, without evidence of aneurysm or dissection in the abdominal or pelvic vasculature. No lymphadenopathy noted in the abdomen or pelvis. Reproductive: Prostate gland and seminal vesicles are unremarkable in appearance. Other: No significant volume of ascites.  No pneumoperitoneum. Musculoskeletal: There are no aggressive appearing lytic or blastic lesions noted in the visualized portions of the skeleton. IMPRESSION: 1. In addition to multiple nonobstructive calculi within the collecting systems of both kidneys there is a 6 mm obstructive calculus in the proximal left ureter immediately beyond the left ureteropelvic junction with mild proximal left hydroureteronephrosis. 2. Aortic atherosclerosis. Electronically Signed   By: Trudie Reed M.D.   On: 07/06/2018 14:35     ____________________________________________   PROCEDURES Procedures  ____________________________________________  DIFFERENTIAL DIAGNOSIS   Ureterolithiasis, hydronephrosis, diverticulitis  CLINICAL IMPRESSION / ASSESSMENT AND PLAN / ED COURSE  Medications ordered in the ED: Medications  tamsulosin (FLOMAX) capsule 0.4 mg (has no administration in time range)  ketorolac (TORADOL) 30 MG/ML injection 15 mg (15 mg Intravenous Given 07/06/18 1401)  HYDROmorphone (DILAUDID) injection 1 mg (1 mg Intravenous Given 07/06/18 1401)  sodium chloride 0.9 % bolus 1,000 mL (1,000 mLs Intravenous New Bag/Given 07/06/18 1410)  ondansetron (ZOFRAN) injection 4 mg (4 mg Intravenous Given 07/06/18 1401)    Pertinent labs & imaging results that were available during my care of the patient were reviewed by me and considered in my medical decision making (see chart for details).  CHIDOZIE BREGENZER was evaluated in Emergency Department on 07/06/2018 for the symptoms described  in the history of present illness. He was evaluated in the context of the global COVID-19 pandemic, which necessitated consideration that the patient might be at risk for infection with the SARS-CoV-2 virus that causes COVID-19. Institutional protocols and algorithms that pertain to the evaluation of patients at risk for COVID-19 are in a state of rapid change based on information released by regulatory bodies including the CDC and federal and state organizations. These policies and algorithms were followed during the patient's care in the ED.   Patient presents with sudden severe left flank pain.  Vital signs unremarkable except for hypertension which I think is in large part pain related.  Labs unremarkable, CT scan shows a 6 mm stone in the proximal left ureter with mild hydronephrosis.  This should be small enough to pass, and without any signs of acute renal insufficiency or urinary infection, he can be discharged and follow-up outpatient  with pain control.  I will start him on Flomax as well.  Follow-up with urology and primary care.      ____________________________________________   FINAL CLINICAL IMPRESSION(S) / ED DIAGNOSES    Final diagnoses:  Ureterolithiasis     ED Discharge Orders         Ordered    ibuprofen (MOTRIN IB) 200 MG tablet  Every 6 hours PRN     07/06/18 1453    oxyCODONE-acetaminophen (PERCOCET) 5-325 MG tablet  Every 6 hours PRN     07/06/18 1453    ondansetron (ZOFRAN ODT) 4 MG disintegrating tablet  Every 8 hours PRN     07/06/18 1453    tamsulosin (FLOMAX) 0.4 MG CAPS capsule  Daily     07/06/18 1453          Portions of this note were generated with dragon dictation software. Dictation errors may occur despite best attempts at proofreading.   Sharman Cheek, MD 07/06/18 1455

## 2018-07-06 NOTE — ED Notes (Signed)
Pt ambulatory to lobby with steady gait no complaints of weakness or dizziness.

## 2018-07-07 ENCOUNTER — Telehealth (INDEPENDENT_AMBULATORY_CARE_PROVIDER_SITE_OTHER): Payer: Medicare Other | Admitting: Urology

## 2018-07-07 ENCOUNTER — Telehealth: Payer: Self-pay | Admitting: Urology

## 2018-07-07 DIAGNOSIS — N201 Calculus of ureter: Secondary | ICD-10-CM

## 2018-07-07 DIAGNOSIS — R109 Unspecified abdominal pain: Secondary | ICD-10-CM

## 2018-07-07 NOTE — Progress Notes (Signed)
Virtual Visit via Video Note  I connected with Danny Castro on 07/07/18 at 12:00 PM EDT by a video enabled telemedicine application and verified that I am speaking with the correct person using two identifiers.   I discussed the limitations of evaluation and management by telemedicine and the availability of in person appointments. The patient expressed understanding and agreed to proceed.  History of Present Illness: 68 year old male with a personal history of nephrolithiasis who presents today via virtual visit to discuss his left flank pain/left ureteral stone.  He was seen and evaluated yesterday in the emergency room with acute onset left flank pain with associated nausea.  No fevers or chills.  No urinary symptoms.  CT scan revealed a 6 mm left proximal ureteral calculus with proximal hydroureteronephrosis.  His pain was ultimately able to be controlled and he was discharged home.  He did have a mild leukocytosis of 16 but his UA was negative other than for blood  He continues to have significant and severe pain.  He has been taking oxycodone, 2 tablets at a time as prescribed by the emergency room.  He also has medication for nausea.  He denies any fevers.  He does have a personal history of nephrolithiasis.  He underwent ureteroscopy 2 years ago with Dr. Sherryl Barters.  He is also had successful ESWL's in the past.  He is not on any anticoagulants.  No recent use of any OTC meds.  Medical history only significant for osteoarthritis.   Observations/Objective: Appears in mild distress, accompanied by his wife today.  Assessment and Plan:  1. Left ureteral stone Obstructing left proximal ureteral calculus, easily seen on scout imaging.  We discussed observation versus surgical intervention for the stone.  Given the size of the stone, he does have a chance of passing it spontaneously, however he is in significant amount of pain and thus will not likely tolerate continued observation.  He  would like to opt for surgical intervention.  We discussed various treatment options including ESWL vs. ureteroscopy, laser lithotripsy, and stent. We discussed the risks and benefits of both including bleeding/ hematoma, infection, damage to surrounding structures, efficacy with need for possible further intervention, and need for temporary ureteral stent.  We discussed the efficacy of each in terms of stone free clearance rates.  He is most interested in ESWL.  We reviewed the preoperative, postoperative, and periprocedural instructions at length today.  He is advised to not take any OTC meds in the interim.  He will be n.p.o. at midnight.  We will plan for left ESWL tomorrow.  All questions were answered.  2. Left flank pain Secondary to #1 Oxycodone as needed    Follow Up Instructions: Schedule ESWL (left)   I discussed the assessment and treatment plan with the patient. The patient was provided an opportunity to ask questions and all were answered. The patient agreed with the plan and demonstrated an understanding of the instructions.   The patient was advised to call back or seek an in-person evaluation if the symptoms worsen or if the condition fails to improve as anticipated.  I provided 25 minutes of non-face-to-face time during this encounter.   [name], MD

## 2018-07-07 NOTE — Telephone Encounter (Signed)
Please take a look at this patient's ER visit from 4/21.  Patient's wife states that he has a 26mm kidney stone and was told to follow up in the office today for possible lithotripsy on Thursday.  Please advise if you want the patient to come in to the office today.

## 2018-07-07 NOTE — H&P (View-Only) (Signed)
Virtual Visit via Video Note  I connected with Danny Castro on 07/07/18 at 12:00 PM EDT by a video enabled telemedicine application and verified that I am speaking with the correct person using two identifiers.   I discussed the limitations of evaluation and management by telemedicine and the availability of in person appointments. The patient expressed understanding and agreed to proceed.  History of Present Illness: 67-year-old male with a personal history of nephrolithiasis who presents today via virtual visit to discuss his left flank pain/left ureteral stone.  He was seen and evaluated yesterday in the emergency room with acute onset left flank pain with associated nausea.  No fevers or chills.  No urinary symptoms.  CT scan revealed a 6 mm left proximal ureteral calculus with proximal hydroureteronephrosis.  His pain was ultimately able to be controlled and he was discharged home.  He did have a mild leukocytosis of 16 but his UA was negative other than for blood  He continues to have significant and severe pain.  He has been taking oxycodone, 2 tablets at a time as prescribed by the emergency room.  He also has medication for nausea.  He denies any fevers.  He does have a personal history of nephrolithiasis.  He underwent ureteroscopy 2 years ago with Dr. Budzyn.  He is also had successful ESWL's in the past.  He is not on any anticoagulants.  No recent use of any OTC meds.  Medical history only significant for osteoarthritis.   Observations/Objective: Appears in mild distress, accompanied by his wife today.  Assessment and Plan:  1. Left ureteral stone Obstructing left proximal ureteral calculus, easily seen on scout imaging.  We discussed observation versus surgical intervention for the stone.  Given the size of the stone, he does have a chance of passing it spontaneously, however he is in significant amount of pain and thus will not likely tolerate continued observation.  He  would like to opt for surgical intervention.  We discussed various treatment options including ESWL vs. ureteroscopy, laser lithotripsy, and stent. We discussed the risks and benefits of both including bleeding/ hematoma, infection, damage to surrounding structures, efficacy with need for possible further intervention, and need for temporary ureteral stent.  We discussed the efficacy of each in terms of stone free clearance rates.  He is most interested in ESWL.  We reviewed the preoperative, postoperative, and periprocedural instructions at length today.  He is advised to not take any OTC meds in the interim.  He will be n.p.o. at midnight.  We will plan for left ESWL tomorrow.  All questions were answered.  2. Left flank pain Secondary to #1 Oxycodone as needed    Follow Up Instructions: Schedule ESWL (left)   I discussed the assessment and treatment plan with the patient. The patient was provided an opportunity to ask questions and all were answered. The patient agreed with the plan and demonstrated an understanding of the instructions.   The patient was advised to call back or seek an in-person evaluation if the symptoms worsen or if the condition fails to improve as anticipated.  I provided 25 minutes of non-face-to-face time during this encounter.   Abella Shugart, MD   

## 2018-07-07 NOTE — Telephone Encounter (Signed)
Please see if he is willing to do a virtual visit with me today to discuss.  [name], MD

## 2018-07-07 NOTE — Telephone Encounter (Signed)
Virtual visit scheduled for today with Dr. Apolinar Junes.

## 2018-07-08 ENCOUNTER — Ambulatory Visit: Payer: Medicare Other

## 2018-07-08 ENCOUNTER — Other Ambulatory Visit: Payer: Self-pay

## 2018-07-08 ENCOUNTER — Encounter
Admission: RE | Disposition: A | Discharge status: Home / Self Care | Payer: Self-pay | Source: Home / Self Care | Attending: Urology

## 2018-07-08 ENCOUNTER — Telehealth: Payer: Self-pay | Admitting: Urology

## 2018-07-08 ENCOUNTER — Encounter: Payer: Self-pay | Admitting: *Deleted

## 2018-07-08 ENCOUNTER — Ambulatory Visit
Admission: RE | Admit: 2018-07-08 | Discharge: 2018-07-08 | Disposition: A | Discharge status: Home / Self Care | Payer: Medicare Other | Attending: Urology | Admitting: Urology

## 2018-07-08 DIAGNOSIS — Z87442 Personal history of urinary calculi: Secondary | ICD-10-CM | POA: Diagnosis not present

## 2018-07-08 DIAGNOSIS — N132 Hydronephrosis with renal and ureteral calculous obstruction: Secondary | ICD-10-CM | POA: Insufficient documentation

## 2018-07-08 DIAGNOSIS — M1990 Unspecified osteoarthritis, unspecified site: Secondary | ICD-10-CM | POA: Diagnosis not present

## 2018-07-08 DIAGNOSIS — N200 Calculus of kidney: Secondary | ICD-10-CM

## 2018-07-08 HISTORY — PX: EXTRACORPOREAL SHOCK WAVE LITHOTRIPSY: SHX1557

## 2018-07-08 SURGERY — LITHOTRIPSY, ESWL
Anesthesia: Choice

## 2018-07-08 MED ORDER — DIPHENHYDRAMINE HCL 25 MG PO CAPS
25.0000 mg | ORAL_CAPSULE | ORAL | Status: AC
  Administered 2018-07-08: 10:00:00 25 mg via ORAL

## 2018-07-08 MED ORDER — CIPROFLOXACIN HCL 500 MG PO TABS
500.0000 mg | ORAL_TABLET | ORAL | Status: AC
  Administered 2018-07-08: 500 mg via ORAL

## 2018-07-08 MED ORDER — OXYCODONE-ACETAMINOPHEN 5-325 MG PO TABS: 1.0000 | ORAL_TABLET | Freq: Four times a day (QID) | ORAL | 0 refills | Status: AC | PRN

## 2018-07-08 MED ORDER — ONDANSETRON HCL 4 MG/2ML IJ SOLN
4.0000 mg | Freq: Once | INTRAMUSCULAR | Status: AC
  Administered 2018-07-08: 11:00:00 4 mg via INTRAVENOUS

## 2018-07-08 MED ORDER — SODIUM CHLORIDE 0.9 % IV SOLN
INTRAVENOUS | Status: DC
  Administered 2018-07-08: 11:00:00 via INTRAVENOUS

## 2018-07-08 MED ORDER — DIPHENHYDRAMINE HCL 25 MG PO CAPS
ORAL_CAPSULE | ORAL | Status: AC
  Administered 2018-07-08: 25 mg via ORAL
  Filled 2018-07-08: qty 1

## 2018-07-08 MED ORDER — ONDANSETRON HCL 4 MG/2ML IJ SOLN
INTRAMUSCULAR | Status: AC
  Administered 2018-07-08: 11:00:00 4 mg via INTRAVENOUS
  Filled 2018-07-08: qty 2

## 2018-07-08 MED ORDER — DIAZEPAM 5 MG PO TABS
ORAL_TABLET | ORAL | Status: AC
  Administered 2018-07-08: 10:00:00 10 mg via ORAL
  Filled 2018-07-08: qty 2

## 2018-07-08 MED ORDER — CIPROFLOXACIN HCL 500 MG PO TABS
ORAL_TABLET | ORAL | Status: AC
  Administered 2018-07-08: 10:00:00 500 mg via ORAL
  Filled 2018-07-08: qty 1

## 2018-07-08 MED ORDER — DIAZEPAM 5 MG PO TABS
10.0000 mg | ORAL_TABLET | ORAL | Status: AC
  Administered 2018-07-08: 10:00:00 10 mg via ORAL

## 2018-07-08 NOTE — Discharge Instructions (Signed)

## 2018-07-08 NOTE — Interval H&P Note (Signed)
History and Physical Interval Note:  07/08/2018 1:32 PM  Danny Castro  has presented today for surgery, with the diagnosis of left ureteral stone.  The various methods of treatment have been discussed with the patient and family. After consideration of risks, benefits and other options for treatment, the patient has consented to  Procedure(s): EXTRACORPOREAL SHOCK WAVE LITHOTRIPSY (ESWL) (N/A) as a surgical intervention.  The patient's history has been reviewed, patient examined, no change in status, stable for surgery.  I have reviewed the patient's chart and labs.  Questions were answered to the patient's satisfaction.     [name]

## 2018-07-08 NOTE — Telephone Encounter (Signed)
Patient presented today for shockwave lithotripsy.  Although it did appear that the stone was visible on preoperative KUB this morning, in the truck, the stone was not able to be identified.  Contrast was given x2 without flow of urine down the ureter tube.  After greater than an hour of trying to identify the stone, the procedure was aborted.  Plan for supportive care, medical expulsive therapy.  Will arrange for virtual visit Monday to reassess.  If he continues to have pain over the weekend, we will tentatively consider ureteroscopy on Wednesday next week.  Both he and his wife are agreeable this plan.  Percocet refilled x8 tablets.  [name], MD

## 2018-07-08 NOTE — Progress Notes (Signed)
Ch visited pt in pre-op. Pt shared that he has a hx of kidney stones and that he is having a lithotripsy. Pt states that the pain meds were not working for him at home. Pt does construction work. Ch provided a compassionate presence before pt was taken back for procedure.    07/08/18 1100  Clinical Encounter Type  Visited With Patient  Visit Type Psychological support;Spiritual support;Social support;Pre-op  Spiritual Encounters  Spiritual Needs Emotional;Grief support  Stress Factors  Patient Stress Factors Health changes  Family Stress Factors Major life changes

## 2018-07-12 ENCOUNTER — Other Ambulatory Visit: Payer: Self-pay

## 2018-07-12 ENCOUNTER — Telehealth (INDEPENDENT_AMBULATORY_CARE_PROVIDER_SITE_OTHER): Payer: Medicare Other | Admitting: Urology

## 2018-07-12 DIAGNOSIS — N200 Calculus of kidney: Secondary | ICD-10-CM

## 2018-07-12 DIAGNOSIS — N201 Calculus of ureter: Secondary | ICD-10-CM

## 2018-07-12 NOTE — Progress Notes (Signed)
Virtual Visit via Telephone Note  I connected with Danny Castro on 07/12/18 at  1:00 PM EDT by telephone and verified that I am speaking with the correct person using two identifiers.   I discussed the limitations, risks, security and privacy concerns of performing an evaluation and management service by telephone and the availability of in person appointments. I also discussed with the patient that there may be a patient responsible charge related to this service. The patient expressed understanding and agreed to proceed.   History of Present Illness: 68 year old male with an obstructing left proximal ureteral calculus he was taken to the check for ESWL last week however the stone was unable to be visualized despite contrast administration.  He was given copious IV fluids at the time.  Patient presents today via telephone visit discuss management of his stone.  He reports today that he passed the stone on Friday afternoon which he describes as sharp tooth shaped and dark in color.  He has not had any subsequent flank pain or any other urinary symptoms.  He does have bilateral nonobstructing stones.  He does report that in the wintertime, he deftly does not drink enough water.  He was advised to undergo 24-hour urine metabolic evaluation in the past but never actually completed the study.  He does have a history of recurrent stones, last surgery 2018.   Observations/Objective: Interactive, pleasant  Assessment and Plan:  1. Recurrent kidney stones Multiple recurrent stones  Patient does have a multiple bilateral nonobstructing stones, will could be offered prophylactic ureteroscopy to treat his stones pending patient preference wants COVID 19 situation has resolved   He may be interested in this  We discussed general stone prevention techniques including drinking plenty water with goal of producing 2.5 L urine daily, increased citric acid intake, avoidance of high oxalate containing  foods, and decreased salt intake.  Information about dietary recommendations given today.  He will drop by and bring his recently passed stone for stone analysis  I recommended follow-up in 3 months with 24-hour urine evaluation prior to follow-up, instructions sent to patient via MyChart  2. Left ureteral stone As above    Follow Up Instructions: 3 months with litholink eval prior   I discussed the assessment and treatment plan with the patient. The patient was provided an opportunity to ask questions and all were answered. The patient agreed with the plan and demonstrated an understanding of the instructions.   The patient was advised to call back or seek an in-person evaluation if the symptoms worsen or if the condition fails to improve as anticipated.  I provided 12 minutes of non-face-to-face time during this encounter.   [name], MD

## 2018-07-12 NOTE — Patient Instructions (Addendum)
Dietary Guidelines to Help Prevent Kidney Stones Kidney stones are deposits of minerals and salts that form inside your kidneys. Your risk of developing kidney stones may be greater depending on your diet, your lifestyle, the medicines you take, and whether you have certain medical conditions. Most people can reduce their chances of developing kidney stones by following the instructions below. Depending on your overall health and the type of kidney stones you tend to develop, your dietitian may give you more specific instructions. What are tips for following this plan? Reading food labels  Choose foods with "no salt added" or "low-salt" labels. Limit your sodium intake to less than 1500 mg per day.  Choose foods with calcium for each meal and snack. Try to eat about 300 mg of calcium at each meal. Foods that contain 200-500 mg of calcium per serving include: ? 8 oz (237 ml) of milk, fortified nondairy milk, and fortified fruit juice. ? 8 oz (237 ml) of kefir, yogurt, and soy yogurt. ? 4 oz (118 ml) of tofu. ? 1 oz of cheese. ? 1 cup (300 g) of dried figs. ? 1 cup (91 g) of cooked broccoli. ? 1-3 oz can of sardines or mackerel.  Most people need 1000 to 1500 mg of calcium each day. Talk to your dietitian about how much calcium is recommended for you. Shopping  Buy plenty of fresh fruits and vegetables. Most people do not need to avoid fruits and vegetables, even if they contain nutrients that may contribute to kidney stones.  When shopping for convenience foods, choose: ? Whole pieces of fruit. ? Premade salads with dressing on the side. ? Low-fat fruit and yogurt smoothies.  Avoid buying frozen meals or prepared deli foods.  Look for foods with live cultures, such as yogurt and kefir. Cooking  Do not add salt to food when cooking. Place a salt shaker on the table and allow each person to add his or her own salt to taste.  Use vegetable protein, such as beans, textured vegetable  protein (TVP), or tofu instead of meat in pasta, casseroles, and soups. Meal planning   Eat less salt, if told by your dietitian. To do this: ? Avoid eating processed or premade food. ? Avoid eating fast food.  Eat less animal protein, including cheese, meat, poultry, or fish, if told by your dietitian. To do this: ? Limit the number of times you have meat, poultry, fish, or cheese each week. Eat a diet free of meat at least 2 days a week. ? Eat only one serving each day of meat, poultry, fish, or seafood. ? When you prepare animal protein, cut pieces into small portion sizes. For most meat and fish, one serving is about the size of one deck of cards.  Eat at least 5 servings of fresh fruits and vegetables each day. To do this: ? Keep fruits and vegetables on hand for snacks. ? Eat 1 piece of fruit or a handful of berries with breakfast. ? Have a salad and fruit at lunch. ? Have two kinds of vegetables at dinner.  Limit foods that are high in a substance called oxalate. These include: ? Spinach. ? Rhubarb. ? Beets. ? Potato chips and french fries. ? Nuts.  If you regularly take a diuretic medicine, make sure to eat at least 1-2 fruits or vegetables high in potassium each day. These include: ? Avocado. ? Banana. ? Orange, prune, carrot, or tomato juice. ? Baked potato. ? Cabbage. ? Beans and split   peas. General instructions   Drink enough fluid to keep your urine clear or pale yellow. This is the most important thing you can do.  Talk to your health care provider and dietitian about taking daily supplements. Depending on your health and the cause of your kidney stones, you may be advised: ? Not to take supplements with vitamin C. ? To take a calcium supplement. ? To take a daily probiotic supplement. ? To take other supplements such as magnesium, fish oil, or vitamin B6.  Take all medicines and supplements as told by your health care provider.  Limit alcohol intake to no  more than 1 drink a day for nonpregnant women and 2 drinks a day for men. One drink equals 12 oz of beer, 5 oz of wine, or 1 oz of hard liquor.  Lose weight if told by your health care provider. Work with your dietitian to find strategies and an eating plan that works best for you. What foods are not recommended? Limit your intake of the following foods, or as told by your dietitian. Talk to your dietitian about specific foods you should avoid based on the type of kidney stones and your overall health. Grains Breads. Bagels. Rolls. Baked goods. Salted crackers. Cereal. Pasta. Vegetables Spinach. Rhubarb. Beets. Canned vegetables. Angie Fava. Olives. Meats and other protein foods Nuts. Nut butters. Large portions of meat, poultry, or fish. Salted or cured meats. Deli meats. Hot dogs. Sausages. Dairy Cheese. Beverages Regular soft drinks. Regular vegetable juice. Seasonings and other foods Seasoning blends with salt. Salad dressings. Canned soups. Soy sauce. Ketchup. Barbecue sauce. Canned pasta sauce. Casseroles. Pizza. Lasagna. Frozen meals. Potato chips. [place] fries. Summary  You can reduce your risk of kidney stones by making changes to your diet.  The most important thing you can do is drink enough fluid. You should drink enough fluid to keep your urine clear or pale yellow.  Ask your health care provider or dietitian how much protein from animal sources you should eat each day, and also how much salt and calcium you should have each day. This information is not intended to replace advice given to you by your health care provider. Make sure you discuss any questions you have with your health care provider. Document Released: 06/28/2010 Document Revised: 02/12/2016 Document Reviewed: 02/12/2016 Elsevier Interactive Patient Education  2019 Elsevier Inc.     [facility]  You will receive a box/kit in the mail that will have a urine jug and  instructions in the kit.  When the box arrives you will need to call our office [phone] to schedule a LAB appointment.  You will need to do a 24hour urine and this should be done during the days that our office will be open.  For example any day from Sunday through Thursday.  How to collect the urine sample: On the day you start the urine sample this 1st morning urine should NOT be collected.  For the rest of the day including all night urines should be collected.  On the next morning the 1st urine should be collected and then you will be finished with the urine collections.  You will need to bring the box with you on your LAB appointment day after urine has been collected and all instructions are complete in the box.  Your blood will be drawn and the box will be collected by our Lab employee to be sent off for analysis.  When urine and blood is complete you  will need to schedule a follow up appointment for lab results.

## 2018-07-15 NOTE — Progress Notes (Signed)
Called pt as well informed him of the information needed for litholink

## 2018-07-15 NOTE — Progress Notes (Signed)
Litholink ordered, instructions mailed

## 2018-07-26 ENCOUNTER — Other Ambulatory Visit: Payer: Self-pay | Admitting: Urology

## 2018-07-26 ENCOUNTER — Telehealth: Payer: Self-pay | Admitting: Urology

## 2018-07-26 NOTE — Telephone Encounter (Signed)
Please let him know his stone as primarily calcium oxalate, the most common type of stone.  [name], MD

## 2018-07-27 NOTE — Telephone Encounter (Signed)
Called pt, no answer. LM informing pt of the information below.  

## 2018-10-20 ENCOUNTER — Ambulatory Visit: Payer: Medicare Other | Admitting: Urology

## 2018-11-18 ENCOUNTER — Telehealth: Payer: Self-pay | Admitting: *Deleted

## 2018-11-18 NOTE — Telephone Encounter (Signed)
Called patient left VM to return call.

## 2018-11-23 ENCOUNTER — Telehealth: Payer: Self-pay | Admitting: *Deleted

## 2018-11-23 NOTE — Telephone Encounter (Signed)
Left VM to reschedule appointment due to not completing Litholink

## 2018-11-24 ENCOUNTER — Encounter: Payer: Self-pay | Admitting: Urology

## 2018-11-24 ENCOUNTER — Ambulatory Visit: Payer: Medicare Other | Admitting: Urology

## 2019-02-04 DIAGNOSIS — E538 Deficiency of other specified B group vitamins: Secondary | ICD-10-CM | POA: Insufficient documentation

## 2019-05-19 ENCOUNTER — Ambulatory Visit: Payer: Medicare Other | Attending: Internal Medicine

## 2019-05-19 DIAGNOSIS — Z23 Encounter for immunization: Secondary | ICD-10-CM | POA: Insufficient documentation

## 2019-05-19 NOTE — Progress Notes (Signed)
   Covid-19 Vaccination Clinic  Name:  Danny Castro    MRN: [id number] DOB: 07-02-1950  05/19/2019  [name] was observed post Covid-19 immunization for 15 minutes without incident. He was provided with Vaccine Information Sheet and instruction to access the V-Safe system.   [name] was instructed to call 911 with any severe reactions post vaccine: Marland Kitchen Difficulty breathing  . Swelling of face and throat  . A fast heartbeat  . A bad rash all over body  . Dizziness and weakness   Immunizations Administered    Name Date Dose VIS Date Route   Pfizer COVID-19 Vaccine 05/19/2019 10:25 AM 0.3 mL 02/25/2019 Intramuscular   Manufacturer: [employer], [employer]   Lot: [id number]   NDC: [address]-2

## 2019-05-20 ENCOUNTER — Ambulatory Visit: Payer: Medicare Other

## 2019-06-10 ENCOUNTER — Ambulatory Visit: Payer: Medicare Other | Attending: Internal Medicine

## 2019-06-10 DIAGNOSIS — Z23 Encounter for immunization: Secondary | ICD-10-CM

## 2019-06-10 NOTE — Progress Notes (Signed)
   Covid-19 Vaccination Clinic  Name:  Danny Castro    MRN: [id number] DOB: 08/30/1950  06/10/2019  Danny Castro was observed post Covid-19 immunization for 15 minutes without incident. He was provided with Vaccine Information Sheet and instruction to access the V-Safe system.   Danny Castro was instructed to call 911 with any severe reactions post vaccine: Marland Kitchen Difficulty breathing  . Swelling of face and throat  . A fast heartbeat  . A bad rash all over body  . Dizziness and weakness   Immunizations Administered    Name Date Dose VIS Date Route   Pfizer COVID-19 Vaccine 06/10/2019  9:56 AM 0.3 mL 02/25/2019 Intramuscular   Manufacturer: [employer], [employer]   Lot: [id number]   NDC: [address]-1

## 2020-02-27 ENCOUNTER — Ambulatory Visit: Payer: Medicare Other | Admitting: Dermatology

## 2021-02-11 ENCOUNTER — Encounter: Payer: Self-pay | Admitting: Radiology

## 2021-02-11 ENCOUNTER — Other Ambulatory Visit: Payer: Self-pay

## 2021-02-11 ENCOUNTER — Emergency Department
Admission: EM | Admit: 2021-02-11 | Discharge: 2021-02-11 | Disposition: A | Discharge status: Home / Self Care | Payer: Medicare Other | Attending: Emergency Medicine | Admitting: Emergency Medicine

## 2021-02-11 ENCOUNTER — Emergency Department: Payer: Medicare Other

## 2021-02-11 DIAGNOSIS — R0789 Other chest pain: Secondary | ICD-10-CM | POA: Diagnosis present

## 2021-02-11 DIAGNOSIS — I10 Essential (primary) hypertension: Secondary | ICD-10-CM | POA: Diagnosis not present

## 2021-02-11 DIAGNOSIS — Z87891 Personal history of nicotine dependence: Secondary | ICD-10-CM | POA: Diagnosis not present

## 2021-02-11 DIAGNOSIS — R079 Chest pain, unspecified: Secondary | ICD-10-CM

## 2021-02-11 LAB — COMPREHENSIVE METABOLIC PANEL
ALT: 32 U/L (ref 0–44)
AST: 37 U/L (ref 15–41)
Albumin: 3.7 g/dL (ref 3.5–5.0)
Alkaline Phosphatase: 70 U/L (ref 38–126)
Anion gap: 6 (ref 5–15)
BUN: 18 mg/dL (ref 8–23)
CO2: 26 mmol/L (ref 22–32)
Calcium: 8.1 mg/dL — ABNORMAL LOW (ref 8.9–10.3)
Chloride: 106 mmol/L (ref 98–111)
Creatinine, Ser: 0.86 mg/dL (ref 0.61–1.24)
GFR, Estimated: >60 mL/min (ref >60)
Glucose, Bld: 105 mg/dL — ABNORMAL HIGH (ref 70–99)
Potassium: 3.8 mmol/L (ref 3.5–5.1)
Sodium: 138 mmol/L (ref 135–145)
Total Bilirubin: 0.8 mg/dL (ref 0.3–1.2)
Total Protein: 6.7 g/dL (ref 6.5–8.1)

## 2021-02-11 LAB — TROPONIN I (HIGH SENSITIVITY)
Troponin I (High Sensitivity): 4 ng/L (ref <18)
Troponin I (High Sensitivity): 5 ng/L (ref <18)

## 2021-02-11 LAB — CBC
HCT: 44 % (ref 39.0–52.0)
Hemoglobin: 14.7 g/dL (ref 13.0–17.0)
MCH: 30.5 pg (ref 26.0–34.0)
MCHC: 33.4 g/dL (ref 30.0–36.0)
MCV: 91.3 fL (ref 80.0–100.0)
Platelets: 244 10*3/uL (ref 150–400)
RBC: 4.82 MIL/uL (ref 4.22–5.81)
RDW: 15.3 % (ref 11.5–15.5)
WBC: 10.8 10*3/uL — ABNORMAL HIGH (ref 4.0–10.5)
nRBC: 0 % (ref 0.0–0.2)

## 2021-02-11 MED ORDER — ALUM & MAG HYDROXIDE-SIMETH 200-200-20 MG/5ML PO SUSP
30.0000 mL | Freq: Once | ORAL | Status: AC
  Administered 2021-02-11: 09:00:00 30 mL via ORAL
  Filled 2021-02-11: qty 30

## 2021-02-11 MED ORDER — LIDOCAINE VISCOUS HCL 2 % MT SOLN
15.0000 mL | Freq: Once | OROMUCOSAL | Status: AC
  Administered 2021-02-11: 09:00:00 15 mL via ORAL
  Filled 2021-02-11: qty 15

## 2021-02-11 NOTE — ED Provider Notes (Signed)
[facility]  ____________________________________________   Event Date/Time   First MD Initiated Contact with Patient 02/11/21 0800     (approximate)  I have reviewed the triage vital signs and the nursing notes.   HISTORY  Chief Complaint Chest Pain    HPI Danny Castro is a 70 y.o. male with past medical history of hypertension, osteoarthritis, nephrolithiasis, no cardiac history who presents with chest pain.  Symptoms started approximately 5 days ago.  Started on the right side of his chest and felt like a pulled muscle.  It is now spread to the both sides of his chest and has occurred intermittently since then.  Last night it woke him from sleep and was worse than it had been and was constant.  Describes as a burning-like sensation.  Pain does not radiate and has no associated shortness of breath, dyspnea nausea diaphoresis extender.  Denies abdominal pain.  Patient notes that over the last several days he has played golf and was actually less symptomatic when he was active and primarily feels the symptoms when he sits down to rest.  Denies history of similar.  Does have a history of acid reflux.         Past Medical History:  Diagnosis Date   Calculus of kidney 08/19/2013   History of kidney stones    Nephrolithiasis    OA (osteoarthritis)    Primary osteoarthritis of both knees 04/10/2014    Patient Active Problem List   Diagnosis Date Noted   Prostate cancer screening 04/11/2016   Enlarged prostate on rectal examination 04/11/2016   Primary osteoarthritis of both knees 04/10/2014   Calculus of kidney 08/19/2013    Past Surgical History:  Procedure Laterality Date   COLONOSCOPY     CYSTOSCOPY W/ URETERAL STENT PLACEMENT Right 04/04/2016   Procedure: CYSTOSCOPY WITH STENT REPLACEMENT;  Surgeon: Hildred Laser, MD;  Location: [facility];  Service: Urology;  Laterality: Right;   CYSTOSCOPY WITH STENT PLACEMENT Right 03/13/2016   Procedure:  CYSTOSCOPY WITH STENT PLACEMENT;  Surgeon: Hildred Laser, MD;  Location: [facility];  Service: Urology;  Laterality: Right;   EXTRACORPOREAL SHOCK WAVE LITHOTRIPSY Right 01/04/2015   Procedure: EXTRACORPOREAL SHOCK WAVE LITHOTRIPSY (ESWL);  Surgeon: Lorraine Lax, MD;  Location: [facility];  Service: Urology;  Laterality: Right;   EXTRACORPOREAL SHOCK WAVE LITHOTRIPSY N/A 07/08/2018   Procedure: EXTRACORPOREAL SHOCK WAVE LITHOTRIPSY (ESWL);  Surgeon: [name], MD;  Location: [facility];  Service: Urology;  Laterality: N/A;   TONSILLECTOMY     URETEROSCOPY Right 03/13/2016   Procedure: URETEROSCOPY;  Surgeon: Hildred Laser, MD;  Location: [facility];  Service: Urology;  Laterality: Right;   URETEROSCOPY WITH HOLMIUM LASER LITHOTRIPSY Right 04/04/2016   Procedure: URETEROSCOPY WITH HOLMIUM LASER LITHOTRIPSY;  Surgeon: Hildred Laser, MD;  Location: [facility];  Service: Urology;  Laterality: Right;    Prior to Admission medications   Medication Sig Start Date End Date Taking? Authorizing Provider  ibuprofen (MOTRIN IB) 200 MG tablet Take 3 tablets (600 mg total) by mouth every 6 (six) hours as needed. 07/06/18   Nita Sickle, MD  naproxen sodium (ANAPROX) 220 MG tablet Take 440 mg by mouth 2 (two) times daily as needed (for pain.).    [provider]  omeprazole (PRILOSEC) 20 MG capsule Take 20 mg by mouth daily as needed (for acid reflux/indigestion).     [provider]  ondansetron (ZOFRAN ODT) 4 MG disintegrating tablet Take 1 tablet (4  mg total) by mouth every 8 (eight) hours as needed for nausea or vomiting. Patient not taking: Reported on 04/11/2016 03/01/16   Darci Current, MD  ondansetron (ZOFRAN ODT) 4 MG disintegrating tablet Take 1 tablet (4 mg total) by mouth every 8 (eight) hours as needed for nausea or vomiting. 07/06/18   Don Perking, [place], MD  ondansetron (ZOFRAN) 4 MG tablet Take 4 mg by mouth every 8 (eight) hours as needed for nausea/vomiting.  03/01/16   [provider]  oxymetazoline (AFRIN) 0.05 % nasal spray Place 1 spray into both nostrils at bedtime as needed for congestion.     [provider]  Phenazopyridine HCl 97.5 MG TABS Take 97.5 mg by mouth every 4 (four) hours as needed (burning).    [provider]  tamsulosin (FLOMAX) 0.4 MG CAPS capsule Take 1 capsule (0.4 mg total) by mouth daily. 07/06/18   Nita Sickle, MD    Allergies Prednisone  Family History  Problem Relation Age of Onset   Prostate cancer Father    Nephrolithiasis Father     Social History Social History   Tobacco Use   Smoking status: Former    Types: Cigarettes    Quit date: 11/29/2010    Years since quitting: 10.2   Smokeless tobacco: Never  Substance Use Topics   Alcohol use: Yes    Alcohol/week: 0.0 standard drinks   Drug use: No    Review of Systems   Review of Systems  Constitutional:  Negative for chills, diaphoresis and fever.  Respiratory:  Negative for shortness of breath.   Cardiovascular:  Positive for chest pain.  Gastrointestinal:  Negative for abdominal pain, nausea and vomiting.  All other systems reviewed and are negative.  Physical Exam Updated Vital Signs BP (!) 144/86 (BP Location: Left Arm)   Pulse 71   Temp 97.8 F (36.6 C) (Oral)   Resp 20   Ht 5\' 9"  (1.753 m)   Wt 75.8 kg   SpO2 97%   BMI 24.66 kg/m   Physical Exam Vitals and nursing note reviewed.  Constitutional:      General: He is not in acute distress.    Appearance: Normal appearance.  HENT:     Head: Normocephalic and atraumatic.  Eyes:     General: No scleral icterus.    Conjunctiva/sclera: Conjunctivae normal.  Cardiovascular:     Heart sounds: Normal heart sounds.  Pulmonary:     Effort: Pulmonary effort is normal. No respiratory distress.     Breath sounds: Normal breath sounds. No wheezing.  Abdominal:     Palpations: Abdomen is soft.     Tenderness: There is no abdominal tenderness. There is no  guarding.  Musculoskeletal:        General: No deformity or signs of injury.     Cervical back: Normal range of motion.     Right lower leg: No edema.     Left lower leg: No edema.  Skin:    Coloration: Skin is not jaundiced or pale.  Neurological:     General: No focal deficit present.     Mental Status: He is alert and oriented to person, place, and time. Mental status is at baseline.  Psychiatric:        Mood and Affect: Mood normal.        Behavior: Behavior normal.     LABS (all labs ordered are listed, but only abnormal results are displayed)  Labs Reviewed  CBC - Abnormal; Notable for the  following components:      Result Value   WBC 10.8 (*)    All other components within normal limits  COMPREHENSIVE METABOLIC PANEL - Abnormal; Notable for the following components:   Glucose, Bld 105 (*)    Calcium 8.1 (*)    All other components within normal limits  TROPONIN I (HIGH SENSITIVITY)  TROPONIN I (HIGH SENSITIVITY)   ____________________________________________  EKG  NSR, nml axis, nml intervals, no acute ischemic changes  ____________________________________________  RADIOLOGY Ky Barban, personally viewed and evaluated these images (plain radiographs) as part of my medical decision making, as well as reviewing the written report by the radiologist.  ED MD interpretation:  I reviewed the CXR which does not show any acute cardiopulmonary process      ____________________________________________   PROCEDURES  Procedure(s) performed (including Critical Care):  Procedures   ____________________________________________   INITIAL IMPRESSION / ASSESSMENT AND PLAN / ED COURSE     Patient is a 70 year old male presenting with 5 days of intermittent chest pain that worsened last night.  Vital signs within normal limits.  Patient is very well-appearing on exam, normal cardiopulmonary exam, no lower extremity edema and abdomen is benign.  His pain  has been constant since last night he has 2 negative troponins and a reassuring EKG, eventually ruling out MI.  His pain is atypical and that it is burning without associated symptoms and is actually improved with exertion and worsened by rest.  I am reassured that this is unlikely to be angina.  More likely GI related.  We discussed low likelihood that this is cardiac but unable to rule this out completely so he should follow-up with his primary care provider and if any new or worsening symptoms should return to emergency department.  Advised that he continue omeprazole.  Clinical Course as of 02/11/21 0847  Mon Feb 11, 2021  0824 Temp(!): 97.4 F (36.3 C) [KM]    Clinical Course User Index [KM] Georga Hacking, MD     ____________________________________________   FINAL CLINICAL IMPRESSION(S) / ED DIAGNOSES  Final diagnoses:  Chest pain, unspecified type     ED Discharge Orders     None        Note:  This document was prepared using Dragon voice recognition software and may include unintentional dictation errors.    Georga Hacking, MD 02/11/21 [phone]

## 2021-02-11 NOTE — ED Triage Notes (Signed)
Pt in with co chest burning x 3 days, denies any cardiac history.

## 2021-02-11 NOTE — Discharge Instructions (Signed)
Your blood work, EKG and chest x-ray were all reassuring today.  It is possible that your chest pain is related to acid reflux.  Please follow-up with your primary care provider if you continue to have symptoms.  If you have any new or worsening symptoms, please return to the emergency department.

## 2021-02-19 ENCOUNTER — Encounter: Payer: Self-pay | Admitting: Emergency Medicine

## 2021-02-19 ENCOUNTER — Other Ambulatory Visit: Payer: Self-pay | Admitting: Physician Assistant

## 2021-02-19 ENCOUNTER — Other Ambulatory Visit: Payer: Self-pay

## 2021-02-19 ENCOUNTER — Emergency Department: Payer: Medicare Other

## 2021-02-19 ENCOUNTER — Emergency Department
Admission: EM | Admit: 2021-02-19 | Discharge: 2021-02-19 | Disposition: A | Discharge status: Home / Self Care | Payer: Medicare Other | Attending: Emergency Medicine | Admitting: Emergency Medicine

## 2021-02-19 DIAGNOSIS — Z87891 Personal history of nicotine dependence: Secondary | ICD-10-CM | POA: Diagnosis not present

## 2021-02-19 DIAGNOSIS — N23 Unspecified renal colic: Secondary | ICD-10-CM | POA: Diagnosis not present

## 2021-02-19 DIAGNOSIS — N201 Calculus of ureter: Secondary | ICD-10-CM | POA: Diagnosis not present

## 2021-02-19 DIAGNOSIS — N200 Calculus of kidney: Secondary | ICD-10-CM | POA: Diagnosis not present

## 2021-02-19 DIAGNOSIS — R109 Unspecified abdominal pain: Secondary | ICD-10-CM | POA: Diagnosis present

## 2021-02-19 LAB — CBC WITH DIFFERENTIAL/PLATELET
Abs Immature Granulocytes: 0.1 10*3/uL — ABNORMAL HIGH (ref 0.00–0.07)
Basophils Absolute: 0 10*3/uL (ref 0.0–0.1)
Basophils Relative: 0 %
Eosinophils Absolute: 0 10*3/uL (ref 0.0–0.5)
Eosinophils Relative: 0 %
HCT: 45.2 % (ref 39.0–52.0)
Hemoglobin: 14.8 g/dL (ref 13.0–17.0)
Immature Granulocytes: 1 %
Lymphocytes Relative: 21 %
Lymphs Abs: 3.4 10*3/uL (ref 0.7–4.0)
MCH: 29.7 pg (ref 26.0–34.0)
MCHC: 32.7 g/dL (ref 30.0–36.0)
MCV: 90.8 fL (ref 80.0–100.0)
Monocytes Absolute: 1.4 10*3/uL — ABNORMAL HIGH (ref 0.1–1.0)
Monocytes Relative: 9 %
Neutro Abs: 10.9 10*3/uL — ABNORMAL HIGH (ref 1.7–7.7)
Neutrophils Relative %: 69 %
Platelets: 268 10*3/uL (ref 150–400)
RBC: 4.98 MIL/uL (ref 4.22–5.81)
RDW: 15.7 % — ABNORMAL HIGH (ref 11.5–15.5)
WBC: 15.9 10*3/uL — ABNORMAL HIGH (ref 4.0–10.5)
nRBC: 0 % (ref 0.0–0.2)

## 2021-02-19 LAB — COMPREHENSIVE METABOLIC PANEL
ALT: 25 U/L (ref 0–44)
AST: 31 U/L (ref 15–41)
Albumin: 4 g/dL (ref 3.5–5.0)
Alkaline Phosphatase: 68 U/L (ref 38–126)
Anion gap: 8 (ref 5–15)
BUN: 29 mg/dL — ABNORMAL HIGH (ref 8–23)
CO2: 27 mmol/L (ref 22–32)
Calcium: 8.6 mg/dL — ABNORMAL LOW (ref 8.9–10.3)
Chloride: 99 mmol/L (ref 98–111)
Creatinine, Ser: 1.18 mg/dL (ref 0.61–1.24)
GFR, Estimated: >60 mL/min (ref >60)
Glucose, Bld: 119 mg/dL — ABNORMAL HIGH (ref 70–99)
Potassium: 3.9 mmol/L (ref 3.5–5.1)
Sodium: 134 mmol/L — ABNORMAL LOW (ref 135–145)
Total Bilirubin: 0.7 mg/dL (ref 0.3–1.2)
Total Protein: 7.5 g/dL (ref 6.5–8.1)

## 2021-02-19 LAB — URINALYSIS, COMPLETE (UACMP) WITH MICROSCOPIC
Bacteria, UA: NONE SEEN
Bilirubin Urine: NEGATIVE
Glucose, UA: NEGATIVE mg/dL
Ketones, ur: NEGATIVE mg/dL
Leukocytes,Ua: NEGATIVE
Nitrite: NEGATIVE
Protein, ur: NEGATIVE mg/dL
Specific Gravity, Urine: 1.016 (ref 1.005–1.030)
Squamous Epithelial / HPF: NONE SEEN (ref 0–5)
pH: 7 (ref 5.0–8.0)

## 2021-02-19 MED ORDER — ONDANSETRON HCL 4 MG/2ML IJ SOLN
INTRAMUSCULAR | Status: AC
  Filled 2021-02-19: qty 2

## 2021-02-19 MED ORDER — OXYCODONE-ACETAMINOPHEN 5-325 MG PO TABS: 1.0000 | ORAL_TABLET | ORAL | 0 refills | Status: DC | PRN

## 2021-02-19 MED ORDER — IBUPROFEN 400 MG PO TABS: 400.0000 mg | ORAL_TABLET | Freq: Four times a day (QID) | ORAL | 0 refills | Status: AC | PRN

## 2021-02-19 MED ORDER — ONDANSETRON HCL 4 MG PO TABS: 4.0000 mg | ORAL_TABLET | Freq: Every day | ORAL | 1 refills | Status: AC | PRN

## 2021-02-19 MED ORDER — CEPHALEXIN 500 MG PO CAPS: 500.0000 mg | ORAL_CAPSULE | Freq: Three times a day (TID) | ORAL | 0 refills | Status: DC

## 2021-02-19 MED ORDER — ACETAMINOPHEN 500 MG PO TABS
1000.0000 mg | ORAL_TABLET | Freq: Once | ORAL | Status: AC
  Administered 2021-02-19: 1000 mg via ORAL

## 2021-02-19 MED ORDER — OXYCODONE HCL 5 MG PO TABS
ORAL_TABLET | ORAL | Status: AC
  Filled 2021-02-19: qty 1

## 2021-02-19 MED ORDER — ACETAMINOPHEN 500 MG PO TABS
ORAL_TABLET | ORAL | Status: AC
  Filled 2021-02-19: qty 2

## 2021-02-19 MED ORDER — TAMSULOSIN HCL 0.4 MG PO CAPS: 0.4000 mg | ORAL_CAPSULE | Freq: Every day | ORAL | 0 refills | Status: DC

## 2021-02-19 MED ORDER — TAMSULOSIN HCL 0.4 MG PO CAPS
ORAL_CAPSULE | ORAL | Status: AC
  Filled 2021-02-19: qty 1

## 2021-02-19 MED ORDER — ONDANSETRON HCL 4 MG/2ML IJ SOLN
4.0000 mg | Freq: Once | INTRAMUSCULAR | Status: AC
  Administered 2021-02-19: 4 mg via INTRAVENOUS

## 2021-02-19 MED ORDER — KETOROLAC TROMETHAMINE 30 MG/ML IJ SOLN
15.0000 mg | Freq: Once | INTRAMUSCULAR | Status: AC
  Administered 2021-02-19: 15 mg via INTRAVENOUS

## 2021-02-19 MED ORDER — KETOROLAC TROMETHAMINE 30 MG/ML IJ SOLN
INTRAMUSCULAR | Status: AC
  Filled 2021-02-19: qty 1

## 2021-02-19 MED ORDER — TAMSULOSIN HCL 0.4 MG PO CAPS
0.4000 mg | ORAL_CAPSULE | Freq: Once | ORAL | Status: AC
Start: 2021-02-19 — End: 2021-02-19
  Administered 2021-02-19: 0.4 mg via ORAL

## 2021-02-19 MED ORDER — FENTANYL CITRATE PF 50 MCG/ML IJ SOSY
PREFILLED_SYRINGE | INTRAMUSCULAR | Status: AC
  Filled 2021-02-19: qty 1

## 2021-02-19 MED ORDER — OXYCODONE HCL 5 MG PO TABS
5.0000 mg | ORAL_TABLET | Freq: Once | ORAL | Status: AC
  Administered 2021-02-19: 5 mg via ORAL

## 2021-02-19 MED ORDER — FENTANYL CITRATE PF 50 MCG/ML IJ SOSY
50.0000 ug | PREFILLED_SYRINGE | Freq: Once | INTRAMUSCULAR | Status: AC
  Administered 2021-02-19: 50 ug via INTRAVENOUS

## 2021-02-19 NOTE — Discharge Instructions (Addendum)
For pain take Percocet as needed.  Take Zofran for nausea.  Take Flomax to help pass the stone.  You can take ibuprofen for the rest of the day but please stop all NSAIDs by the end of tonight, so that you can undergo the procedure on Thursday.  Return to the emergency room if the pain is not well controlled at home, if you have fever or abdominal pain or burning with urination.  We can see the stone on the x-ray.  Please follow-up in urology clinic today to fill out paperwork in preparation for your lithotripsy on Thursday.

## 2021-02-19 NOTE — ED Notes (Signed)
Patient to CT via stretcher.

## 2021-02-19 NOTE — H&P (View-Only) (Signed)
Urology Consult  I have been asked to see the patient by Dr. Don Perking, for evaluation and management of right renal colic.  Chief Complaint: RLQ pain, nausea  History of Present Illness: Danny Castro is a 70 y.o. year old male with PMH nephrolithiasis was previously undergone ESWL and ureteroscopy who presented to the ED this morning with reports of sudden onset RLQ pain and nausea.  Admission labs notable for WBC count 15.9; creatinine 1.18 (baseline 0.9); and UA with 6-10 RBCs/hpf, 0-5 WBCs/hpf, no bacteria, and no nitrites.  CT stone study reveals an 8 mm obstructing mid right ureteral stone as well as multiple nonobstructing bilateral nephrolithiasis.  He underwent KUB and stone is visible.  He has received Zofran, fentanyl, Toradol, and oxycodone in the ED and reports his pain has resolved.  He is accompanied today at the bedside by his wife.  He reports he previously has tolerated stents poorly and prefers ESWL.  He would ideally like to pursue ESWL for management of his right ureteral stone.  Past Medical History:  Diagnosis Date   Calculus of kidney 08/19/2013   History of kidney stones    Nephrolithiasis    OA (osteoarthritis)    Primary osteoarthritis of both knees 04/10/2014    Past Surgical History:  Procedure Laterality Date   COLONOSCOPY     CYSTOSCOPY W/ URETERAL STENT PLACEMENT Right 04/04/2016   Procedure: CYSTOSCOPY WITH STENT REPLACEMENT;  Surgeon: Hildred Laser, MD;  Location: [facility];  Service: Urology;  Laterality: Right;   CYSTOSCOPY WITH STENT PLACEMENT Right 03/13/2016   Procedure: CYSTOSCOPY WITH STENT PLACEMENT;  Surgeon: Hildred Laser, MD;  Location: [facility];  Service: Urology;  Laterality: Right;   EXTRACORPOREAL SHOCK WAVE LITHOTRIPSY Right 01/04/2015   Procedure: EXTRACORPOREAL SHOCK WAVE LITHOTRIPSY (ESWL);  Surgeon: Lorraine Lax, MD;  Location: [facility];  Service: Urology;  Laterality: Right;   EXTRACORPOREAL SHOCK WAVE LITHOTRIPSY N/A  07/08/2018   Procedure: EXTRACORPOREAL SHOCK WAVE LITHOTRIPSY (ESWL);  Surgeon: [name], MD;  Location: [facility];  Service: Urology;  Laterality: N/A;   TONSILLECTOMY     URETEROSCOPY Right 03/13/2016   Procedure: URETEROSCOPY;  Surgeon: Hildred Laser, MD;  Location: [facility];  Service: Urology;  Laterality: Right;   URETEROSCOPY WITH HOLMIUM LASER LITHOTRIPSY Right 04/04/2016   Procedure: URETEROSCOPY WITH HOLMIUM LASER LITHOTRIPSY;  Surgeon: Hildred Laser, MD;  Location: [facility];  Service: Urology;  Laterality: Right;    Home Medications:  Current Meds  Medication Sig   cephALEXin (KEFLEX) 500 MG capsule Take 1 capsule (500 mg total) by mouth 3 (three) times daily for 5 days.   ibuprofen (ADVIL) 400 MG tablet Take 1 tablet (400 mg total) by mouth every 6 (six) hours as needed for up to 1 day.   ondansetron (ZOFRAN) 4 MG tablet Take 1 tablet (4 mg total) by mouth daily as needed for nausea or vomiting.   oxyCODONE-acetaminophen (PERCOCET) 5-325 MG tablet Take 1 tablet by mouth every 4 (four) hours as needed.   tamsulosin (FLOMAX) 0.4 MG CAPS capsule Take 1 capsule (0.4 mg total) by mouth daily for 7 days.    Allergies:  Allergies  Allergen Reactions   Prednisone Anxiety    Family History  Problem Relation Age of Onset   Prostate cancer Father    Nephrolithiasis Father     Social History:  reports that he quit smoking about 10 years ago. He has never used smokeless tobacco. He reports current alcohol use. He  reports that he does not use drugs.  ROS: A complete review of systems was performed.  All systems are negative except for pertinent findings as noted.  Physical Exam:  Vital signs in last 24 hours: Temp:  [97.8 F (36.6 C)] 97.8 F (36.6 C) (12/06 0335) Pulse Rate:  [59-75] 64 (12/06 0700) Resp:  [15-20] 15 (12/06 0700) BP: (166-190)/(86-124) 174/86 (12/06 0700) SpO2:  [99 %-100 %] 99 % (12/06 0700) Weight:  [75.8 kg] 75.8 kg (12/06  0334) Constitutional:  Alert and oriented, no acute distress HEENT: [place] AT, moist mucus membranes Cardiovascular: No clubbing, cyanosis, or edema Respiratory: Normal respiratory effort Skin: No rashes, bruises or suspicious lesions Neurologic: Grossly intact, no focal deficits, moving all 4 extremities Psychiatric: Normal mood and affect  Laboratory Data:  Recent Labs    02/19/21 0400  WBC 15.9*  HGB 14.8  HCT 45.2   Recent Labs    02/19/21 0400  NA 134*  K 3.9  CL 99  CO2 27  GLUCOSE 119*  BUN 29*  CREATININE 1.18  CALCIUM 8.6*   Urinalysis    Component Value Date/Time   COLORURINE YELLOW (A) 02/19/2021 0532   APPEARANCEUR CLEAR (A) 02/19/2021 0532   APPEARANCEUR Cloudy (A) 04/11/2016 1328   LABSPEC 1.016 02/19/2021 0532   PHURINE 7.0 02/19/2021 0532   GLUCOSEU NEGATIVE 02/19/2021 0532   HGBUR MODERATE (A) 02/19/2021 0532   BILIRUBINUR NEGATIVE 02/19/2021 0532   BILIRUBINUR Negative 04/11/2016 1328   KETONESUR NEGATIVE 02/19/2021 0532   PROTEINUR NEGATIVE 02/19/2021 0532   NITRITE NEGATIVE 02/19/2021 0532   LEUKOCYTESUR NEGATIVE 02/19/2021 0532   Results for orders placed or performed in visit on 04/11/16  Microscopic Examination     Status: Abnormal   Collection Time: 04/11/16  1:28 PM   URINE  Result Value Ref Range Status   WBC, UA 6-10 (A) 0 - 5 /hpf Final   RBC, UA >30 (A) 0 - 2 /hpf Final   Epithelial Cells (non renal) None seen 0 - 10 /hpf Final   Bacteria, UA None seen None seen/Few Final    Radiologic Imaging: DG Abd 1 View  Result Date: 02/19/2021 CLINICAL DATA:  Left flank pain, history of kidney stones EXAM: ABDOMEN - 1 VIEW COMPARISON:  CT abdomen pelvis, 02/19/2021, 4:23 a.m. FINDINGS: Mildly gas distended small bowel loops in the central abdomen. Moderate burden of stool throughout the left and right colon. Gas is present to the rectum. No obvious free air. Multiple bilateral inferior pole renal calculi. IMPRESSION: 1. Multiple bilateral  inferior pole renal calculi. 2. Mildly gas distended small bowel loops in the central abdomen, suggestive of ileus. Gas and stool is present in the colon to the rectum. Electronically Signed   By: Jearld Lesch M.D.   On: 02/19/2021 09:27   CT Renal Stone Study  Result Date: 02/19/2021 CLINICAL DATA:  Awoke with nausea and flank pain EXAM: CT ABDOMEN AND PELVIS WITHOUT CONTRAST TECHNIQUE: Multidetector CT imaging of the abdomen and pelvis was performed following the standard protocol without IV contrast. COMPARISON:  11/05/2018 FINDINGS: Lower chest:  Generous heart size.  Coronary atherosclerosis. Hepatobiliary: No focal liver abnormality.No evidence of biliary obstruction or stone. Pancreas: Generalized fatty infiltration Spleen: Unremarkable. Adrenals/Urinary Tract: Negative adrenals. Right hydroureteronephrosis due to a proximal ureteric stone measuring 8 by 6 mm. Asymmetric right perinephric stranding attributed to the same. Additional notable right-sided renal calculus measuring 11 mm. At least 4 right renal calculi. At least 4, smaller, left renal calculi. Unremarkable bladder.  Stomach/Bowel: No obstruction. No appendicitis. Left colonic diverticulosis. Vascular/Lymphatic: No acute vascular abnormality. Atheromatous calcification of the aorta and iliacs. No mass or adenopathy. Reproductive:No pathologic findings. Other: No ascites or pneumoperitoneum. Haziness of mesenteric fat which is stable and attributed to remote inflammation. No progressive or worrisome lymph nodes. Musculoskeletal: No acute abnormalities. Lumbar spine degeneration with mild scoliosis. IMPRESSION: 1. Obstructing 8 x 6 mm proximal right ureteric calculus. 2. Multiple bilateral renal calculi. 3.  Aortic Atherosclerosis (ICD10-I70.0).  Coronary atherosclerosis. Electronically Signed   By: Tiburcio Pea M.D.   On: 02/19/2021 04:46    Assessment & Plan:  70 year old male with a history of nephrolithiasis requiring intervention  admitted with right renal colic secondary to an obstructing 8 mm mid right ureteral stone.  No evidence of urinary infection today.  Stone is radiopaque and patient wishes to pursue ESWL.  We discussed proceeding with ESWL this Thursday and patient is agreeable to proceed.  Orders placed for this this morning.  We discussed that we will only treat the obstructing right ureteral stone with this treatment.  He expressed understanding.  Okay for discharge from the urologic perspective given that pain is well controlled.  Please discharge with Flomax, Percocet, and Zofran.  Also may discharge with a small volume of oral Toradol to last through the end of today; he will need to stop NSAIDs 24 hours prior to ESWL on Thursday.  Thank you for involving me in this patient's care, please page with any further questions or concerns.  Carman Ching, PA-C 02/19/2021 10:05 AM

## 2021-02-19 NOTE — Progress Notes (Signed)
ESWL ORDER FORM  Expected date of procedure: 02/21/2021  Surgeon: Nickolas Madrid, MD  Post op standing: 2-4wk follow up w/KUB prior  Anticoagulation/Aspirin/NSAID standing order: Hold all 72 hours prior  Anesthesia standing order: MAC  VTE standing: SCD's  Dx: Right Ureteral Stone  Procedure: right Extracorporeal shock wave lithotripsy  CPT : 41962  Standing Order Set:   *NPO after mn, KUB  *NS 171m/hr, Keflex 5055mPO, Benadryl 2551mO, Valium 69m56m, Zofran 4mg 46m   Medications if other than standing orders:   NONE

## 2021-02-19 NOTE — Consult Note (Signed)
 Urology Consult  I have been asked to see the patient by Dr. Veronese, for evaluation and management of right renal colic.  Chief Complaint: RLQ pain, nausea  History of Present Illness: Danny Castro is a 70 y.o. year old male with PMH nephrolithiasis was previously undergone ESWL and ureteroscopy who presented to the ED this morning with reports of sudden onset RLQ pain and nausea.  Admission labs notable for WBC count 15.9; creatinine 1.18 (baseline 0.9); and UA with 6-10 RBCs/hpf, 0-5 WBCs/hpf, no bacteria, and no nitrites.  CT stone study reveals an 8 mm obstructing mid right ureteral stone as well as multiple nonobstructing bilateral nephrolithiasis.  He underwent KUB and stone is visible.  He has received Zofran, fentanyl, Toradol, and oxycodone in the ED and reports his pain has resolved.  He is accompanied today at the bedside by his wife.  He reports he previously has tolerated stents poorly and prefers ESWL.  He would ideally like to pursue ESWL for management of his right ureteral stone.  Past Medical History:  Diagnosis Date   Calculus of kidney 08/19/2013   History of kidney stones    Nephrolithiasis    OA (osteoarthritis)    Primary osteoarthritis of both knees 04/10/2014    Past Surgical History:  Procedure Laterality Date   COLONOSCOPY     CYSTOSCOPY W/ URETERAL STENT PLACEMENT Right 04/04/2016   Procedure: CYSTOSCOPY WITH STENT REPLACEMENT;  Surgeon: Brian James Budzyn, MD;  Location: [facility];  Service: Urology;  Laterality: Right;   CYSTOSCOPY WITH STENT PLACEMENT Right 03/13/2016   Procedure: CYSTOSCOPY WITH STENT PLACEMENT;  Surgeon: Brian James Budzyn, MD;  Location: [facility];  Service: Urology;  Laterality: Right;   EXTRACORPOREAL SHOCK WAVE LITHOTRIPSY Right 01/04/2015   Procedure: EXTRACORPOREAL SHOCK WAVE LITHOTRIPSY (ESWL);  Surgeon: Richard D Hart, MD;  Location: [facility];  Service: Urology;  Laterality: Right;   EXTRACORPOREAL SHOCK WAVE LITHOTRIPSY N/A  07/08/2018   Procedure: EXTRACORPOREAL SHOCK WAVE LITHOTRIPSY (ESWL);  Surgeon: Brandon, Ashley, MD;  Location: [facility];  Service: Urology;  Laterality: N/A;   TONSILLECTOMY     URETEROSCOPY Right 03/13/2016   Procedure: URETEROSCOPY;  Surgeon: Brian James Budzyn, MD;  Location: [facility];  Service: Urology;  Laterality: Right;   URETEROSCOPY WITH HOLMIUM LASER LITHOTRIPSY Right 04/04/2016   Procedure: URETEROSCOPY WITH HOLMIUM LASER LITHOTRIPSY;  Surgeon: Brian James Budzyn, MD;  Location: [facility];  Service: Urology;  Laterality: Right;    Home Medications:  Current Meds  Medication Sig   cephALEXin (KEFLEX) 500 MG capsule Take 1 capsule (500 mg total) by mouth 3 (three) times daily for 5 days.   ibuprofen (ADVIL) 400 MG tablet Take 1 tablet (400 mg total) by mouth every 6 (six) hours as needed for up to 1 day.   ondansetron (ZOFRAN) 4 MG tablet Take 1 tablet (4 mg total) by mouth daily as needed for nausea or vomiting.   oxyCODONE-acetaminophen (PERCOCET) 5-325 MG tablet Take 1 tablet by mouth every 4 (four) hours as needed.   tamsulosin (FLOMAX) 0.4 MG CAPS capsule Take 1 capsule (0.4 mg total) by mouth daily for 7 days.    Allergies:  Allergies  Allergen Reactions   Prednisone Anxiety    Family History  Problem Relation Age of Onset   Prostate cancer Father    Nephrolithiasis Father     Social History:  reports that he quit smoking about 10 years ago. He has never used smokeless tobacco. He reports current alcohol use. He   reports that he does not use drugs.  ROS: A complete review of systems was performed.  All systems are negative except for pertinent findings as noted.  Physical Exam:  Vital signs in last 24 hours: Temp:  [97.8 F (36.6 C)] 97.8 F (36.6 C) (12/06 0335) Pulse Rate:  [59-75] 64 (12/06 0700) Resp:  [15-20] 15 (12/06 0700) BP: (166-190)/(86-124) 174/86 (12/06 0700) SpO2:  [99 %-100 %] 99 % (12/06 0700) Weight:  [75.8 kg] 75.8 kg (12/06  0334) Constitutional:  Alert and oriented, no acute distress HEENT: [place] AT, moist mucus membranes Cardiovascular: No clubbing, cyanosis, or edema Respiratory: Normal respiratory effort Skin: No rashes, bruises or suspicious lesions Neurologic: Grossly intact, no focal deficits, moving all 4 extremities Psychiatric: Normal mood and affect  Laboratory Data:  Recent Labs    02/19/21 0400  WBC 15.9*  HGB 14.8  HCT 45.2   Recent Labs    02/19/21 0400  NA 134*  K 3.9  CL 99  CO2 27  GLUCOSE 119*  BUN 29*  CREATININE 1.18  CALCIUM 8.6*   Urinalysis    Component Value Date/Time   COLORURINE YELLOW (A) 02/19/2021 0532   APPEARANCEUR CLEAR (A) 02/19/2021 0532   APPEARANCEUR Cloudy (A) 04/11/2016 1328   LABSPEC 1.016 02/19/2021 0532   PHURINE 7.0 02/19/2021 0532   GLUCOSEU NEGATIVE 02/19/2021 0532   HGBUR MODERATE (A) 02/19/2021 0532   BILIRUBINUR NEGATIVE 02/19/2021 0532   BILIRUBINUR Negative 04/11/2016 1328   KETONESUR NEGATIVE 02/19/2021 0532   PROTEINUR NEGATIVE 02/19/2021 0532   NITRITE NEGATIVE 02/19/2021 0532   LEUKOCYTESUR NEGATIVE 02/19/2021 0532   Results for orders placed or performed in visit on 04/11/16  Microscopic Examination     Status: Abnormal   Collection Time: 04/11/16  1:28 PM   URINE  Result Value Ref Range Status   WBC, UA 6-10 (A) 0 - 5 /hpf Final   RBC, UA >30 (A) 0 - 2 /hpf Final   Epithelial Cells (non renal) None seen 0 - 10 /hpf Final   Bacteria, UA None seen None seen/Few Final    Radiologic Imaging: DG Abd 1 View  Result Date: 02/19/2021 CLINICAL DATA:  Left flank pain, history of kidney stones EXAM: ABDOMEN - 1 VIEW COMPARISON:  CT abdomen pelvis, 02/19/2021, 4:23 a.m. FINDINGS: Mildly gas distended small bowel loops in the central abdomen. Moderate burden of stool throughout the left and right colon. Gas is present to the rectum. No obvious free air. Multiple bilateral inferior pole renal calculi. IMPRESSION: 1. Multiple bilateral  inferior pole renal calculi. 2. Mildly gas distended small bowel loops in the central abdomen, suggestive of ileus. Gas and stool is present in the colon to the rectum. Electronically Signed   By: Alex D Bibbey M.D.   On: 02/19/2021 09:27   CT Renal Stone Study  Result Date: 02/19/2021 CLINICAL DATA:  Awoke with nausea and flank pain EXAM: CT ABDOMEN AND PELVIS WITHOUT CONTRAST TECHNIQUE: Multidetector CT imaging of the abdomen and pelvis was performed following the standard protocol without IV contrast. COMPARISON:  11/05/2018 FINDINGS: Lower chest:  Generous heart size.  Coronary atherosclerosis. Hepatobiliary: No focal liver abnormality.No evidence of biliary obstruction or stone. Pancreas: Generalized fatty infiltration Spleen: Unremarkable. Adrenals/Urinary Tract: Negative adrenals. Right hydroureteronephrosis due to a proximal ureteric stone measuring 8 by 6 mm. Asymmetric right perinephric stranding attributed to the same. Additional notable right-sided renal calculus measuring 11 mm. At least 4 right renal calculi. At least 4, smaller, left renal calculi. Unremarkable bladder.   Stomach/Bowel: No obstruction. No appendicitis. Left colonic diverticulosis. Vascular/Lymphatic: No acute vascular abnormality. Atheromatous calcification of the aorta and iliacs. No mass or adenopathy. Reproductive:No pathologic findings. Other: No ascites or pneumoperitoneum. Haziness of mesenteric fat which is stable and attributed to remote inflammation. No progressive or worrisome lymph nodes. Musculoskeletal: No acute abnormalities. Lumbar spine degeneration with mild scoliosis. IMPRESSION: 1. Obstructing 8 x 6 mm proximal right ureteric calculus. 2. Multiple bilateral renal calculi. 3.  Aortic Atherosclerosis (ICD10-I70.0).  Coronary atherosclerosis. Electronically Signed   By: Tiburcio Pea M.D.   On: 02/19/2021 04:46    Assessment & Plan:  70 year old male with a history of nephrolithiasis requiring intervention  admitted with right renal colic secondary to an obstructing 8 mm mid right ureteral stone.  No evidence of urinary infection today.  Stone is radiopaque and patient wishes to pursue ESWL.  We discussed proceeding with ESWL this Thursday and patient is agreeable to proceed.  Orders placed for this this morning.  We discussed that we will only treat the obstructing right ureteral stone with this treatment.  He expressed understanding.  Okay for discharge from the urologic perspective given that pain is well controlled.  Please discharge with Flomax, Percocet, and Zofran.  Also may discharge with a small volume of oral Toradol to last through the end of today; he will need to stop NSAIDs 24 hours prior to ESWL on Thursday.  Thank you for involving me in this patient's care, please page with any further questions or concerns.  Carman Ching, PA-C 02/19/2021 10:05 AM

## 2021-02-19 NOTE — ED Provider Notes (Signed)
[facility] Emergency Department Provider Note  ____________________________________________  Time seen: Approximately 4:34 AM  I have reviewed the triage vital signs and the nursing notes.   HISTORY  Chief Complaint Abdominal Pain   HPI Danny Castro is a 70 y.o. male with a history of recurrent kidney stones requiring lithotripsy and stent placement, osteoarthritis and BPH who presents for evaluation of right-sided abdominal pain.  Pain started 2 AM.  Patient reports the pain is severe, sharp, located in the right lower part of his abdomen and constant.  Pain is similar to prior kidney stones.  He reports that he usually never has flank pain with these episodes.  He has had nausea and nonbloody nonbilious emesis.  No hematuria or dysuria, no fever or chills, no chest pain or shortness of breath   Past Medical History:  Diagnosis Date   Calculus of kidney 08/19/2013   History of kidney stones    Nephrolithiasis    OA (osteoarthritis)    Primary osteoarthritis of both knees 04/10/2014    Patient Active Problem List   Diagnosis Date Noted   Prostate cancer screening 04/11/2016   Enlarged prostate on rectal examination 04/11/2016   Primary osteoarthritis of both knees 04/10/2014   Calculus of kidney 08/19/2013    Past Surgical History:  Procedure Laterality Date   COLONOSCOPY     CYSTOSCOPY W/ URETERAL STENT PLACEMENT Right 04/04/2016   Procedure: CYSTOSCOPY WITH STENT REPLACEMENT;  Surgeon: Hildred Laser, MD;  Location: [facility];  Service: Urology;  Laterality: Right;   CYSTOSCOPY WITH STENT PLACEMENT Right 03/13/2016   Procedure: CYSTOSCOPY WITH STENT PLACEMENT;  Surgeon: Hildred Laser, MD;  Location: [facility];  Service: Urology;  Laterality: Right;   EXTRACORPOREAL SHOCK WAVE LITHOTRIPSY Right 01/04/2015   Procedure: EXTRACORPOREAL SHOCK WAVE LITHOTRIPSY (ESWL);  Surgeon: Lorraine Lax, MD;  Location: [facility];  Service: Urology;   Laterality: Right;   EXTRACORPOREAL SHOCK WAVE LITHOTRIPSY N/A 07/08/2018   Procedure: EXTRACORPOREAL SHOCK WAVE LITHOTRIPSY (ESWL);  Surgeon: [name], MD;  Location: [facility];  Service: Urology;  Laterality: N/A;   TONSILLECTOMY     URETEROSCOPY Right 03/13/2016   Procedure: URETEROSCOPY;  Surgeon: Hildred Laser, MD;  Location: [facility];  Service: Urology;  Laterality: Right;   URETEROSCOPY WITH HOLMIUM LASER LITHOTRIPSY Right 04/04/2016   Procedure: URETEROSCOPY WITH HOLMIUM LASER LITHOTRIPSY;  Surgeon: Hildred Laser, MD;  Location: [facility];  Service: Urology;  Laterality: Right;    Prior to Admission medications   Medication Sig Start Date End Date Taking? Authorizing Provider  cephALEXin (KEFLEX) 500 MG capsule Take 1 capsule (500 mg total) by mouth 3 (three) times daily for 5 days. 02/19/21 02/24/21 Yes Airam Heidecker, [place], [place]  ondansetron (ZOFRAN) 4 MG tablet Take 1 tablet (4 mg total) by mouth daily as needed for nausea or vomiting. 02/19/21 02/19/22 Yes Jaelle Campanile, [place], [place]  oxyCODONE-acetaminophen (PERCOCET) 5-325 MG tablet Take 1 tablet by mouth every 4 (four) hours as needed. 02/19/21  Yes Don Perking, [place], MD  tamsulosin (FLOMAX) 0.4 MG CAPS capsule Take 1 capsule (0.4 mg total) by mouth daily for 7 days. 02/19/21 02/26/21 Yes Tylan Kinn, [place], MD  doxycycline (VIBRA-TABS) 100 MG tablet Take 100 mg by mouth 2 (two) times daily. 02/10/21   [provider]  ibuprofen (MOTRIN IB) 200 MG tablet Take 3 tablets (600 mg total) by mouth every 6 (six) hours as needed. 07/06/18   Nita Sickle, MD  naproxen sodium (ANAPROX) 220 MG tablet Take  440 mg by mouth 2 (two) times daily as needed (for pain.).    [provider]  omeprazole (PRILOSEC) 20 MG capsule Take 20 mg by mouth daily as needed (for acid reflux/indigestion).     [provider]  oxymetazoline (AFRIN) 0.05 % nasal spray Place 1 spray into both nostrils at bedtime as needed for  congestion.     [provider]  Phenazopyridine HCl 97.5 MG TABS Take 97.5 mg by mouth every 4 (four) hours as needed (burning).    [provider]  triamcinolone ointment (KENALOG) 0.5 % Apply topically 2 (two) times daily. 02/10/21   [provider]    Allergies Prednisone  Family History  Problem Relation Age of Onset   Prostate cancer Father    Nephrolithiasis Father     Social History Social History   Tobacco Use   Smoking status: Former    Types: Cigarettes    Quit date: 11/29/2010    Years since quitting: 10.2   Smokeless tobacco: Never  Substance Use Topics   Alcohol use: Yes    Alcohol/week: 0.0 standard drinks   Drug use: No    Review of Systems  Constitutional: Negative for fever. Eyes: Negative for visual changes. ENT: Negative for sore throat. Neck: No neck pain  Cardiovascular: Negative for chest pain. Respiratory: Negative for shortness of breath. Gastrointestinal: + R sided abdominal pain, nausea, and vomiting. No diarrhea. Genitourinary: Negative for dysuria. Musculoskeletal: Negative for back pain. Skin: Negative for rash. Neurological: Negative for headaches, weakness or numbness. Psych: No SI or HI  ____________________________________________   PHYSICAL EXAM:  VITAL SIGNS: ED Triage Vitals  Enc Vitals Group     BP 02/19/21 0335 (!) 166/124     Pulse Rate 02/19/21 0335 75     Resp 02/19/21 0335 20     Temp 02/19/21 0335 97.8 F (36.6 C)     Temp Source 02/19/21 0335 Oral     SpO2 02/19/21 0335 100 %     Weight 02/19/21 0334 167 lb (75.8 kg)     Height 02/19/21 0334 5\' 9"  (1.753 m)     Head Circumference --      Peak Flow --      Pain Score 02/19/21 0334 10     Pain Loc --      Pain Edu? --      Excl. in [place]? --     Constitutional: Alert and oriented. Well appearing and in no apparent distress. HEENT:      Head: Normocephalic and atraumatic.         Eyes: Conjunctivae are normal. Sclera is  non-icteric.       Mouth/Throat: Mucous membranes are moist.       Neck: Supple with no signs of meningismus. Cardiovascular: Regular rate and rhythm. No murmurs, gallops, or rubs. 2+ symmetrical distal pulses are present in all extremities. No JVD. Respiratory: Normal respiratory effort. Lungs are clear to auscultation bilaterally.  Gastrointestinal: Soft, non tender, and non distended with positive bowel sounds. No rebound or guarding. Genitourinary: No CVA tenderness. Musculoskeletal:  No edema, cyanosis, or erythema of extremities. Neurologic: Normal speech and language. Face is symmetric. Moving all extremities. No gross focal neurologic deficits are appreciated. Skin: Skin is warm, dry and intact. No rash noted. Psychiatric: Mood and affect are normal. Speech and behavior are normal.  ____________________________________________   LABS (all labs ordered are listed, but only abnormal results are displayed)  Labs Reviewed  URINALYSIS, COMPLETE (UACMP) WITH MICROSCOPIC -  Abnormal; Notable for the following components:      Result Value   Color, Urine YELLOW (*)    APPearance CLEAR (*)    Hgb urine dipstick MODERATE (*)    All other components within normal limits  CBC WITH DIFFERENTIAL/PLATELET - Abnormal; Notable for the following components:   WBC 15.9 (*)    RDW 15.7 (*)    Neutro Abs 10.9 (*)    Monocytes Absolute 1.4 (*)    Abs Immature Granulocytes 0.10 (*)    All other components within normal limits  COMPREHENSIVE METABOLIC PANEL - Abnormal; Notable for the following components:   Sodium 134 (*)    Glucose, Bld 119 (*)    BUN 29 (*)    Calcium 8.6 (*)    All other components within normal limits   ____________________________________________  EKG  none  ____________________________________________  RADIOLOGY  I have personally reviewed the images performed during this visit and I agree with the Radiologist's read.   Interpretation by Radiologist:  CT  Renal Stone Study  Result Date: 02/19/2021 CLINICAL DATA:  Awoke with nausea and flank pain EXAM: CT ABDOMEN AND PELVIS WITHOUT CONTRAST TECHNIQUE: Multidetector CT imaging of the abdomen and pelvis was performed following the standard protocol without IV contrast. COMPARISON:  11/05/2018 FINDINGS: Lower chest:  Generous heart size.  Coronary atherosclerosis. Hepatobiliary: No focal liver abnormality.No evidence of biliary obstruction or stone. Pancreas: Generalized fatty infiltration Spleen: Unremarkable. Adrenals/Urinary Tract: Negative adrenals. Right hydroureteronephrosis due to a proximal ureteric stone measuring 8 by 6 mm. Asymmetric right perinephric stranding attributed to the same. Additional notable right-sided renal calculus measuring 11 mm. At least 4 right renal calculi. At least 4, smaller, left renal calculi. Unremarkable bladder. Stomach/Bowel: No obstruction. No appendicitis. Left colonic diverticulosis. Vascular/Lymphatic: No acute vascular abnormality. Atheromatous calcification of the aorta and iliacs. No mass or adenopathy. Reproductive:No pathologic findings. Other: No ascites or pneumoperitoneum. Haziness of mesenteric fat which is stable and attributed to remote inflammation. No progressive or worrisome lymph nodes. Musculoskeletal: No acute abnormalities. Lumbar spine degeneration with mild scoliosis. IMPRESSION: 1. Obstructing 8 x 6 mm proximal right ureteric calculus. 2. Multiple bilateral renal calculi. 3.  Aortic Atherosclerosis (ICD10-I70.0).  Coronary atherosclerosis. Electronically Signed   By: Jorje Guild M.D.   On: 02/19/2021 04:46     ____________________________________________   PROCEDURES  Procedure(s) performed: None Procedures   Critical Care performed:  None ____________________________________________   INITIAL IMPRESSION / ASSESSMENT AND PLAN / ED COURSE  70 y.o. male with a history of recurrent kidney stones requiring lithotripsy and stent placement,  osteoarthritis and BPH who presents for evaluation of right-sided abdominal pain.  Patient looks well-appearing and in no significant distress.  He is hypertensive in the setting of pain with BP of 188/88.  Abdomen is soft and nontender.  CT renal showing a 8x6 mm right proximal ureteral stone with hydronephrosis.  Labs pending to rule out AKI or overlying infection.  We will treat with Zofran, Toradol, Flomax, fentanyl and reassess.  Old medical records reviewed including notes from prior urology visits and procedures  _________________________ 6:41 AM on 02/19/2021 ----------------------------------------- Discussed with Dr. Diamantina Providence from urology who will plan to have patient on his list for lithotripsy on Thursday.  Patient's pain is well controlled with p.o. medications.  We discussed not using NSAIDs at home.  he was provided with a prescription for Zofran, Keflex, Percocet, Flomax.  Patient is awaiting evaluation from urology PA and after that he is stable for discharge home.  Prescriptions have been sent to the pharmacy.  Discussed my indication for return to the hospital for any signs of overlying UTI.  They have no signs of UTI      _____________________________________________ Please note:  Patient was evaluated in Emergency Department today for the symptoms described in the history of present illness. Patient was evaluated in the context of the global COVID-19 pandemic, which necessitated consideration that the patient might be at risk for infection with the SARS-CoV-2 virus that causes COVID-19. Institutional protocols and algorithms that pertain to the evaluation of patients at risk for COVID-19 are in a state of rapid change based on information released by regulatory bodies including the CDC and federal and state organizations. These policies and algorithms were followed during the patient's care in the ED.  Some ED evaluations and interventions may be delayed as a result of limited staffing  during the pandemic.   [place] Controlled Substance Database was reviewed by me. ____________________________________________   FINAL CLINICAL IMPRESSION(S) / ED DIAGNOSES   Final diagnoses:  Renal colic on right side  Right ureteral stone      NEW MEDICATIONS STARTED DURING THIS VISIT:  ED Discharge Orders          Ordered    tamsulosin (FLOMAX) 0.4 MG CAPS capsule  Daily        02/19/21 0641    oxyCODONE-acetaminophen (PERCOCET) 5-325 MG tablet  Every 4 hours PRN        02/19/21 0641    ondansetron (ZOFRAN) 4 MG tablet  Daily PRN        02/19/21 0641    cephALEXin (KEFLEX) 500 MG capsule  3 times daily        02/19/21 [id number]             Note:  This document was prepared using Dragon voice recognition software and may include unintentional dictation errors.    Rudene Re, MD 02/19/21 [phone]

## 2021-02-19 NOTE — ED Triage Notes (Signed)
Pt to triage via w/c, appears uncomfortable; st awoke 2hrs PTA with rt lower abd pain accomp by nausea; st hx kidney stones; seen recently for shingles

## 2021-02-21 ENCOUNTER — Ambulatory Visit
Admission: RE | Admit: 2021-02-21 | Discharge: 2021-02-21 | Disposition: A | Discharge status: Home / Self Care | Payer: Medicare Other | Attending: Urology | Admitting: Urology

## 2021-02-21 ENCOUNTER — Encounter
Admission: RE | Disposition: A | Discharge status: Home / Self Care | Payer: Self-pay | Source: Home / Self Care | Attending: Urology

## 2021-02-21 ENCOUNTER — Other Ambulatory Visit: Payer: Self-pay

## 2021-02-21 ENCOUNTER — Encounter: Payer: Self-pay | Admitting: Urology

## 2021-02-21 ENCOUNTER — Ambulatory Visit: Payer: Medicare Other

## 2021-02-21 DIAGNOSIS — Z87442 Personal history of urinary calculi: Secondary | ICD-10-CM | POA: Insufficient documentation

## 2021-02-21 DIAGNOSIS — N201 Calculus of ureter: Secondary | ICD-10-CM | POA: Diagnosis not present

## 2021-02-21 HISTORY — PX: EXTRACORPOREAL SHOCK WAVE LITHOTRIPSY: SHX1557

## 2021-02-21 SURGERY — LITHOTRIPSY, ESWL
Anesthesia: Moderate Sedation | Laterality: Right

## 2021-02-21 MED ORDER — ONDANSETRON HCL 4 MG/2ML IJ SOLN: 4.0000 mg | Freq: Once | INTRAMUSCULAR | Status: AC

## 2021-02-21 MED ORDER — TAMSULOSIN HCL 0.4 MG PO CAPS: 0.4000 mg | ORAL_CAPSULE | Freq: Every day | ORAL | 1 refills | Status: AC

## 2021-02-21 MED ORDER — DIAZEPAM 5 MG PO TABS: 10.0000 mg | ORAL_TABLET | ORAL | Status: AC

## 2021-02-21 MED ORDER — OXYCODONE-ACETAMINOPHEN 5-325 MG PO TABS: 1.0000 | ORAL_TABLET | Freq: Four times a day (QID) | ORAL | 0 refills | Status: AC | PRN

## 2021-02-21 MED ORDER — CEPHALEXIN 500 MG PO CAPS: 500.0000 mg | ORAL_CAPSULE | Freq: Once | ORAL | Status: AC

## 2021-02-21 MED ORDER — CEPHALEXIN 500 MG PO CAPS
ORAL_CAPSULE | ORAL | Status: AC
  Administered 2021-02-21: 500 mg via ORAL
  Filled 2021-02-21: qty 1

## 2021-02-21 MED ORDER — SODIUM CHLORIDE 0.9 % IV SOLN: INTRAVENOUS | Status: DC

## 2021-02-21 MED ORDER — ONDANSETRON HCL 4 MG/2ML IJ SOLN
INTRAMUSCULAR | Status: AC
  Administered 2021-02-21: 4 mg via INTRAVENOUS
  Filled 2021-02-21: qty 2

## 2021-02-21 MED ORDER — DIPHENHYDRAMINE HCL 25 MG PO CAPS: 25.0000 mg | ORAL_CAPSULE | ORAL | Status: AC

## 2021-02-21 MED ORDER — DIAZEPAM 5 MG PO TABS
ORAL_TABLET | ORAL | Status: AC
  Administered 2021-02-21: 10 mg via ORAL
  Filled 2021-02-21: qty 2

## 2021-02-21 MED ORDER — DIPHENHYDRAMINE HCL 25 MG PO CAPS
ORAL_CAPSULE | ORAL | Status: AC
  Administered 2021-02-21: 25 mg via ORAL
  Filled 2021-02-21: qty 1

## 2021-02-21 NOTE — Interval H&P Note (Signed)
UROLOGY H&P UPDATE  Agree with prior H&P dated 02/19/21 by Hilton Sinclair, PA. 69mm right mid ureteral stone, multiple right renal stones. Offered URS for management of all stones but he has not tolerated stents well previously and opted for SWL of right mid ureteral stone.  Cardiac: RRR Lungs: CTA bilaterally  Laterality: right Procedure: right shockwave lithotripsy  Urine: UA benign  Informed consent obtained, we specifically discussed the risks of bleeding/hematoma, infection, post-operative pain, obstructive fragments/steinstrasse, need for additional procedures.  Sondra Come, MD 02/21/2021

## 2021-02-21 NOTE — Brief Op Note (Signed)
02/21/2021  8:54 AM  PATIENT:  Danny Castro  70 y.o. male  PRE-OPERATIVE DIAGNOSIS:  14mm mid Right Ureteral Stone  POST-OPERATIVE DIAGNOSIS:  Same  PROCEDURE:  Procedure(s): EXTRACORPOREAL SHOCK WAVE LITHOTRIPSY (ESWL) (Right)  SURGEON:  Surgeon(s) and Role:    * Jordie Schreur, Laurette Schimke, MD - Primary  ANESTHESIA: Conscious Sedation  EBL:  None  Drains: None  Specimen: None  Findings:  Tolerated SWL well, stone dense but appeared to smudge at conclusion  DISPO: Flomax, pain meds PRN, RTC 2 weeks KUB. Consider additional SWL in the future for large renal stones  Legrand Rams, MD 02/21/2021

## 2021-02-21 NOTE — Discharge Instructions (Signed)

## 2021-02-25 ENCOUNTER — Other Ambulatory Visit: Payer: Self-pay

## 2021-02-25 ENCOUNTER — Ambulatory Visit
Admission: RE | Admit: 2021-02-25 | Discharge: 2021-02-25 | Disposition: A | Discharge status: Home / Self Care | Payer: Medicare Other | Source: Ambulatory Visit | Attending: Physician Assistant | Admitting: Physician Assistant

## 2021-02-25 ENCOUNTER — Ambulatory Visit
Admission: RE | Admit: 2021-02-25 | Discharge: 2021-02-25 | Disposition: A | Discharge status: Home / Self Care | Payer: Medicare Other | Source: Ambulatory Visit | Attending: Urology | Admitting: Urology

## 2021-02-25 ENCOUNTER — Encounter: Payer: Self-pay | Admitting: Physician Assistant

## 2021-02-25 ENCOUNTER — Ambulatory Visit (INDEPENDENT_AMBULATORY_CARE_PROVIDER_SITE_OTHER): Payer: Medicare Other | Admitting: Physician Assistant

## 2021-02-25 ENCOUNTER — Telehealth: Payer: Self-pay

## 2021-02-25 VITALS — BP 118/81 | HR 88 | Ht 69.0 in | Wt 165.0 lb

## 2021-02-25 DIAGNOSIS — N201 Calculus of ureter: Secondary | ICD-10-CM

## 2021-02-25 LAB — URINALYSIS, COMPLETE
Bilirubin, UA: NEGATIVE
Glucose, UA: NEGATIVE
Ketones, UA: NEGATIVE
Nitrite, UA: NEGATIVE
Protein,UA: NEGATIVE
Specific Gravity, UA: 1.02 (ref 1.005–1.030)
Urobilinogen, Ur: 0.2 mg/dL (ref 0.2–1.0)
pH, UA: 5.5 (ref 5.0–7.5)

## 2021-02-25 LAB — MICROSCOPIC EXAMINATION: Bacteria, UA: NONE SEEN

## 2021-02-25 NOTE — Patient Instructions (Signed)
To help you pass your stone and manage any symptoms you develop, please do the following: -Continue taking Flomax 0.4mg  daily -Stay well hydrated -Strain your urine to catch any stones that pass -Treat any pain with ibuprofen/acetaminophen or Percocet (oxycodone-acetaminophen). You may take 1-2 tablets every 4-6 hours as needed for pain. -Treat any nausea with Zofran (ondansetron)  We will plan to see you back in clinic next month with anr x-ray prior to see if you have passed your stone.  Please call our office immediately (we are open 8a-5p Monday-Friday) or go to the Emergency Department if you develop any of the following: -Fever -Chills -Nausea and/or vomiting uncontrollable with Zofran -Pain uncontrollable with Percocet

## 2021-02-25 NOTE — Telephone Encounter (Signed)
Incoming call from pt's wife who states her husband had litho on Friday and has continued to be in pain since then. She states that the pain was the worse on Saturday, she states patient was "rolling on the floor in agony", he also had 3 episodes of vomiting on Saturday, denies fever or chills. Pain is significantly better today, but still present in the back and RT groin. Patient and wife feel that he needs to be seen and evaluated to determine if stone was successfully broken up. Patient scheduled.

## 2021-02-25 NOTE — Progress Notes (Signed)
02/25/2021 11:18 AM   Danny Castro [birth year] [id number]  CC: Chief Complaint  Patient presents with   Nephrolithiasis   HPI: Danny Castro is a 70 y.o. male with PMH nephrolithiasis who underwent ESWL with Dr. Richardo Hanks 4 days ago for management of an 8 mm proximal right ureteral stone who presents today for management of right flank pain.    Today he reports acute onset of right flank pain radiating to the RLQ yesterday morning around 2 AM.  This persisted throughout the day but has since resolved.  He denies pain in clinic today.  At the time that he was symptomatic, he took prescribed Percocet and 1/2 to 1 tablet increments, which sometimes improves the pain for short durations.  He also had emesis x1 but did not take Zofran for this.  He has not seen any fragments pass yet.  KUB today with interval migration of the right ureteral stone to the mid right ureter.  The stone does appear to have diminished in size, now measuring approximately 5 x 6 mm.  In-office UA today positive for 2+ blood and trace leukocyte esterase; urine microscopy with 6-10 WBCs/HPF and 3-10 RBCs/HPF.  PMH: Past Medical History:  Diagnosis Date   Calculus of kidney 08/19/2013   History of kidney stones    Nephrolithiasis    OA (osteoarthritis)    Primary osteoarthritis of both knees 04/10/2014    Surgical History: Past Surgical History:  Procedure Laterality Date   COLONOSCOPY     CYSTOSCOPY W/ URETERAL STENT PLACEMENT Right 04/04/2016   Procedure: CYSTOSCOPY WITH STENT REPLACEMENT;  Surgeon: Hildred Laser, MD;  Location: [facility];  Service: Urology;  Laterality: Right;   CYSTOSCOPY WITH STENT PLACEMENT Right 03/13/2016   Procedure: CYSTOSCOPY WITH STENT PLACEMENT;  Surgeon: Hildred Laser, MD;  Location: [facility];  Service: Urology;  Laterality: Right;   EXTRACORPOREAL SHOCK WAVE LITHOTRIPSY Right 01/04/2015   Procedure: EXTRACORPOREAL SHOCK WAVE LITHOTRIPSY (ESWL);  Surgeon: Lorraine Lax, MD;   Location: [facility];  Service: Urology;  Laterality: Right;   EXTRACORPOREAL SHOCK WAVE LITHOTRIPSY N/A 07/08/2018   Procedure: EXTRACORPOREAL SHOCK WAVE LITHOTRIPSY (ESWL);  Surgeon: [name], MD;  Location: [facility];  Service: Urology;  Laterality: N/A;   EXTRACORPOREAL SHOCK WAVE LITHOTRIPSY Right 02/21/2021   Procedure: EXTRACORPOREAL SHOCK WAVE LITHOTRIPSY (ESWL);  Surgeon: Sondra Come, MD;  Location: [facility];  Service: Urology;  Laterality: Right;   TONSILLECTOMY     URETEROSCOPY Right 03/13/2016   Procedure: URETEROSCOPY;  Surgeon: Hildred Laser, MD;  Location: [facility];  Service: Urology;  Laterality: Right;   URETEROSCOPY WITH HOLMIUM LASER LITHOTRIPSY Right 04/04/2016   Procedure: URETEROSCOPY WITH HOLMIUM LASER LITHOTRIPSY;  Surgeon: Hildred Laser, MD;  Location: [facility];  Service: Urology;  Laterality: Right;    Home Medications:  Allergies as of 02/25/2021       Reactions   Prednisone Anxiety        Medication List        Accurate as of February 25, 2021 11:18 AM. If you have any questions, ask your nurse or doctor.          doxycycline 100 MG tablet Commonly known as: VIBRA-TABS Take 100 mg by mouth 2 (two) times daily.   ibuprofen 200 MG tablet Commonly known as: Motrin IB Take 3 tablets (600 mg total) by mouth every 6 (six) hours as needed.   naproxen sodium 220 MG tablet Commonly known as: ALEVE Take 440 mg  by mouth 2 (two) times daily as needed (for pain.).   omeprazole 20 MG capsule Commonly known as: PRILOSEC Take 20 mg by mouth daily as needed (for acid reflux/indigestion).   ondansetron 4 MG tablet Commonly known as: Zofran Take 1 tablet (4 mg total) by mouth daily as needed for nausea or vomiting.   oxyCODONE-acetaminophen 5-325 MG tablet Commonly known as: Percocet Take 1 tablet by mouth every 4 (four) hours as needed.   oxymetazoline 0.05 % nasal spray Commonly known as: AFRIN Place 1 spray into both nostrils at  bedtime as needed for congestion.   Phenazopyridine HCl 97.5 MG Tabs Take 97.5 mg by mouth every 4 (four) hours as needed (burning).   tamsulosin 0.4 MG Caps capsule Commonly known as: FLOMAX Take 1 capsule (0.4 mg total) by mouth daily for 7 days.   triamcinolone ointment 0.5 % Commonly known as: KENALOG Apply topically 2 (two) times daily.        Allergies:  Allergies  Allergen Reactions   Prednisone Anxiety    Family History: Family History  Problem Relation Age of Onset   Prostate cancer Father    Nephrolithiasis Father     Social History:   reports that he quit smoking about 10 years ago. His smoking use included cigarettes. He has never used smokeless tobacco. He reports current alcohol use. He reports that he does not use drugs.  Physical Exam: BP 118/81   Pulse 88   Ht 5\' 9"  (1.753 m)   Wt 165 lb (74.8 kg)   BMI 24.37 kg/m   Constitutional:  Alert and oriented, no acute distress, nontoxic appearing HEENT: [place], AT Cardiovascular: No clubbing, cyanosis, or edema Respiratory: Normal respiratory effort, no increased work of breathing Skin: No rashes, bruises or suspicious lesions Neurologic: Grossly intact, no focal deficits, moving all 4 extremities Psychiatric: Normal mood and affect  Laboratory Data: Results for orders placed or performed in visit on 02/25/21  Microscopic Examination   Urine  Result Value Ref Range   WBC, UA 6-10 (A) 0 - 5 /hpf   RBC 3-10 (A) 0 - 2 /hpf   Epithelial Cells (non renal) 0-10 0 - 10 /hpf   Bacteria, UA None seen None seen/Few  Urinalysis, Complete  Result Value Ref Range   Specific Gravity, UA 1.020 1.005 - 1.030   pH, UA 5.5 5.0 - 7.5   Color, UA Yellow Yellow   Appearance Ur Clear Clear   Leukocytes,UA Trace (A) Negative   Protein,UA Negative Negative/Trace   Glucose, UA Negative Negative   Ketones, UA Negative Negative   RBC, UA 2+ (A) Negative   Bilirubin, UA Negative Negative   Urobilinogen, Ur 0.2 0.2 - 1.0  mg/dL   Nitrite, UA Negative Negative   Microscopic Examination See below:    Pertinent Imaging: KUB, 02/25/2021: CLINICAL DATA:  Right ureteral stone. Patient reports lithotripsy last week, pain onset on Sunday.   EXAM: ABDOMEN - 1 VIEW   COMPARISON:  Most recent radiograph 02/21/2021, CT 02/19/2021   FINDINGS: The stone previously at the level of L4-L5 is tentatively visualized overlying the right aspect of the sacrum. Multiple additional right intrarenal stones, largest measuring 11 mm over the lower renal shadow. Three left intrarenal calculi are seen. Normal bowel gas pattern with moderate volume of colonic stool. No obstruction. Included lung bases are clear. No acute osseous abnormalities are seen.   IMPRESSION: 1. The stone previously at the level of L4-L5 is tentatively visualized overlying the right aspect of  the sacrum. 2. Bilateral intrarenal calculi.     Electronically Signed   By: Narda Rutherford M.D.   On: 02/26/2021 13:05  I personally reviewed the images referenced above and note interval migration of the right ureteral stone to the mid right ureter.  Stone appears to have diminished slightly in size.  Assessment & Plan:   1. Right ureteral stone Acute right renal colic has resolved and UA today is consistent with stone passage, low concern for infection today especially given that his vitals are stable.  I do not see any indication for urgent intervention at this time.  We reviewed renal colic management recommendations today including staying well-hydrated, straining the urine, taking 1-2 Percocet every 4-6 hours for pain control, and taking Zofran as needed for control of nausea/vomiting.  As he is asymptomatic in clinic today, I will defer giving him Toradol, but we discussed that if his pain returns, he may call us back and we may administer this in clinic.  We reviewed return precautions today including fever, chills, uncontrollable nausea/vomiting,  and uncontrollable pain.  He expressed understanding. - Urinalysis, Complete  Return if symptoms worsen or fail to improve.  Carman Ching, PA-C  [facility] [address] [place], [place] [address] [phone]

## 2021-03-19 ENCOUNTER — Ambulatory Visit: Payer: Medicare Other | Admitting: Urology

## 2021-03-19 NOTE — Progress Notes (Signed)
03/20/2021 4:10 PM   Danny Castro 01/10/1951 [id number]  Referring provider: Rusty Aus, MD [facility] [place],  [place] [address]  Chief Complaint  Patient presents with   Nephrolithiasis   Urological history: 1. High risk hematuria -former smoker -CTU 11/2014 - bilateral nephrolithiasis -cysto 2016- refused -cysto 2018 with stent removal - NED  2. Nephrolithiasis -Composition - 90% CaOx / 10% CaPO4 -pre 2017 MET x 3, SWL x 1 -right URS 2017 -SWL 2020   HPI: Danny Castro is a 71 y.o. who is status post ESWL who presents today for follow up.  Underwent ESWL on 02/21/2021 for 8 mm mid right ureteral stone with Dr. Diamantina Providence.  Their postprocedural course was as expected and uneventful.   They have passed fragments.    They did not bring in fragments for analysis.   KUB right ureteral stone is no longer seen.  Bilateral stones appreciated.   UA negative for micro heme   PMH: Past Medical History:  Diagnosis Date   Calculus of kidney 08/19/2013   History of kidney stones    Nephrolithiasis    OA (osteoarthritis)    Primary osteoarthritis of both knees 04/10/2014    Surgical History: Past Surgical History:  Procedure Laterality Date   COLONOSCOPY     CYSTOSCOPY W/ URETERAL STENT PLACEMENT Right 04/04/2016   Procedure: CYSTOSCOPY WITH STENT REPLACEMENT;  Surgeon: Nickie Retort, MD;  Location: [facility];  Service: Urology;  Laterality: Right;   CYSTOSCOPY WITH STENT PLACEMENT Right 03/13/2016   Procedure: CYSTOSCOPY WITH STENT PLACEMENT;  Surgeon: Nickie Retort, MD;  Location: [facility];  Service: Urology;  Laterality: Right;   EXTRACORPOREAL SHOCK WAVE LITHOTRIPSY Right 01/04/2015   Procedure: EXTRACORPOREAL SHOCK WAVE LITHOTRIPSY (ESWL);  Surgeon: Collier Flowers, MD;  Location: [facility];  Service: Urology;  Laterality: Right;   EXTRACORPOREAL SHOCK WAVE LITHOTRIPSY N/A 07/08/2018   Procedure: EXTRACORPOREAL SHOCK WAVE  LITHOTRIPSY (ESWL);  Surgeon: Hollice Espy, MD;  Location: [facility];  Service: Urology;  Laterality: N/A;   EXTRACORPOREAL SHOCK WAVE LITHOTRIPSY Right 02/21/2021   Procedure: EXTRACORPOREAL SHOCK WAVE LITHOTRIPSY (ESWL);  Surgeon: Billey Co, MD;  Location: [facility];  Service: Urology;  Laterality: Right;   TONSILLECTOMY     URETEROSCOPY Right 03/13/2016   Procedure: URETEROSCOPY;  Surgeon: Nickie Retort, MD;  Location: [facility];  Service: Urology;  Laterality: Right;   URETEROSCOPY WITH HOLMIUM LASER LITHOTRIPSY Right 04/04/2016   Procedure: URETEROSCOPY WITH HOLMIUM LASER LITHOTRIPSY;  Surgeon: Nickie Retort, MD;  Location: [facility];  Service: Urology;  Laterality: Right;    Home Medications:  Current Outpatient Medications on File Prior to Visit  Medication Sig Dispense Refill   doxycycline (VIBRA-TABS) 100 MG tablet Take 100 mg by mouth 2 (two) times daily. (Patient not taking: Reported on 02/25/2021)     ibuprofen (MOTRIN IB) 200 MG tablet Take 3 tablets (600 mg total) by mouth every 6 (six) hours as needed. (Patient not taking: Reported on 02/25/2021) 60 tablet 0   naproxen sodium (ANAPROX) 220 MG tablet Take 440 mg by mouth 2 (two) times daily as needed (for pain.). (Patient not taking: Reported on 02/25/2021)     omeprazole (PRILOSEC) 20 MG capsule Take 20 mg by mouth daily as needed (for acid reflux/indigestion).  (Patient not taking: Reported on 02/25/2021)     ondansetron (ZOFRAN) 4 MG tablet Take 1 tablet (4 mg total) by mouth daily as needed for nausea or vomiting. (  Patient not taking: Reported on 02/25/2021) 30 tablet 1   oxyCODONE-acetaminophen (PERCOCET) 5-325 MG tablet Take 1 tablet by mouth every 4 (four) hours as needed. 20 tablet 0   oxymetazoline (AFRIN) 0.05 % nasal spray Place 1 spray into both nostrils at bedtime as needed for congestion.  (Patient not taking: Reported on 02/25/2021)     Phenazopyridine HCl 97.5 MG TABS Take 97.5 mg by mouth every 4  (four) hours as needed (burning). (Patient not taking: Reported on 02/25/2021)     triamcinolone ointment (KENALOG) 0.5 % Apply topically 2 (two) times daily. (Patient not taking: Reported on 02/25/2021)     No current facility-administered medications on file prior to visit.    Allergies:  Allergies  Allergen Reactions   Prednisone Anxiety    Family History: Family History  Problem Relation Age of Onset   Prostate cancer Father    Nephrolithiasis Father     Social History:  reports that he quit smoking about 10 years ago. His smoking use included cigarettes. He has never used smokeless tobacco. He reports current alcohol use. He reports that he does not use drugs.  ROS: Pertinent ROS in HPI  Physical Exam: BP (!) 156/95    Pulse 87    Ht _0  (1.778 m)    BMI 23.68 kg/m   Constitutional:  Well nourished. Alert and oriented, No acute distress. HEENT: [place] AT, mask in place.   Trachea midline. Cardiovascular: No clubbing, cyanosis, or edema. Respiratory: Normal respiratory effort, no increased work of breathing. Neurologic: Grossly intact, no focal deficits, moving all 4 extremities. Psychiatric: Normal mood and affect.  Laboratory Data: Lab Results  Component Value Date   WBC 15.9 (H) 02/19/2021   HGB 14.8 02/19/2021   HCT 45.2 02/19/2021   MCV 90.8 02/19/2021   PLT 268 02/19/2021    Lab Results  Component Value Date   CREATININE 1.18 02/19/2021    Urinalysis Results for orders placed or performed in visit on 03/20/21  Microscopic Examination   Urine  Result Value Ref Range   WBC, UA 0-5 0 - 5 /hpf   RBC 0-2 0 - 2 /hpf   Epithelial Cells (non renal) None seen 0 - 10 /hpf   Bacteria, UA None seen None seen/Few  Urinalysis, Complete  Result Value Ref Range   Specific Gravity, UA 1.020 1.005 - 1.030   pH, UA 7.0 5.0 - 7.5   Color, UA Yellow Yellow   Appearance Ur Clear Clear   Leukocytes,UA Negative Negative   Protein,UA Negative Negative/Trace    Glucose, UA Negative Negative   Ketones, UA Trace (A) Negative   RBC, UA Negative Negative   Bilirubin, UA Negative Negative   Urobilinogen, Ur 1.0 0.2 - 1.0 mg/dL   Nitrite, UA Negative Negative   Microscopic Examination See below:       I have reviewed the labs.   Pertinent Imaging: CLINICAL DATA:  Right kidney stones.   EXAM: ABDOMEN - 1 VIEW   COMPARISON:  Radiograph dated 02/25/2021 and CT dated 02/19/2021.   FINDINGS: Three right renal calculi with the largest measuring approximately 13 mm in the lower pole of the right kidney as seen on the CT. 2 adjacent stones over the inferior pole of the left kidney similar to prior CT with combined length of approximately 1 cm. Additional tiny left renal upper pole calculi are faintly noted. No definite stone noted along the expected course of the ureters.   No bowel dilatation or evidence of  obstruction. No free air. No acute osseous pathology.   IMPRESSION: Bilateral renal calculi as described.     Electronically Signed   By: Anner Crete M.D.   On: 03/20/2021 23:06 I have independently reviewed the films.    Assessment & Plan:    1. Right ureteral stone -passed  2. Microscopic hematuria -resolved   3.  Bilateral nephrolithiasis -Not interested in pursuing metabolic work-up at this time -Not interested in intervention at this time  Return in about 6 months (around 09/17/2021) for KUB .  These notes generated with voice recognition software. I apologize for typographical errors.  Zara Council, PA-C  [facility] [address]  [place], [place] [address] [phone]

## 2021-03-20 ENCOUNTER — Ambulatory Visit
Admission: RE | Admit: 2021-03-20 | Discharge: 2021-03-20 | Disposition: A | Discharge status: Home / Self Care | Payer: Medicare Other | Source: Ambulatory Visit | Attending: Urology | Admitting: Urology

## 2021-03-20 ENCOUNTER — Other Ambulatory Visit: Payer: Self-pay

## 2021-03-20 ENCOUNTER — Ambulatory Visit (INDEPENDENT_AMBULATORY_CARE_PROVIDER_SITE_OTHER): Payer: Medicare Other | Admitting: Urology

## 2021-03-20 VITALS — BP 156/95 | HR 87 | Ht 70.0 in

## 2021-03-20 DIAGNOSIS — N201 Calculus of ureter: Secondary | ICD-10-CM | POA: Insufficient documentation

## 2021-03-20 DIAGNOSIS — R3129 Other microscopic hematuria: Secondary | ICD-10-CM

## 2021-03-20 LAB — URINALYSIS, COMPLETE
Bilirubin, UA: NEGATIVE
Glucose, UA: NEGATIVE
Leukocytes,UA: NEGATIVE
Nitrite, UA: NEGATIVE
Protein,UA: NEGATIVE
RBC, UA: NEGATIVE
Specific Gravity, UA: 1.02 (ref 1.005–1.030)
Urobilinogen, Ur: 1 mg/dL (ref 0.2–1.0)
pH, UA: 7 (ref 5.0–7.5)

## 2021-03-20 LAB — MICROSCOPIC EXAMINATION
Bacteria, UA: NONE SEEN
Epithelial Cells (non renal): NONE SEEN /hpf (ref 0–10)

## 2021-09-19 ENCOUNTER — Encounter: Payer: Self-pay | Admitting: Urology

## 2021-09-19 ENCOUNTER — Ambulatory Visit
Admission: RE | Admit: 2021-09-19 | Discharge: 2021-09-19 | Disposition: A | Discharge status: Home / Self Care | Payer: Medicare Other | Source: Ambulatory Visit | Attending: Urology | Admitting: Urology

## 2021-09-19 ENCOUNTER — Ambulatory Visit: Payer: Medicare Other | Admitting: Urology

## 2021-09-19 ENCOUNTER — Ambulatory Visit
Admission: RE | Admit: 2021-09-19 | Discharge: 2021-09-19 | Disposition: A | Discharge status: Home / Self Care | Payer: Medicare Other | Attending: Urology | Admitting: Urology

## 2021-09-19 VITALS — BP 122/82 | HR 83 | Ht 69.0 in | Wt 162.0 lb

## 2021-09-19 DIAGNOSIS — N201 Calculus of ureter: Secondary | ICD-10-CM | POA: Diagnosis present

## 2021-09-19 DIAGNOSIS — N200 Calculus of kidney: Secondary | ICD-10-CM

## 2021-09-19 NOTE — Progress Notes (Signed)
09/19/21 6:38 PM   Danny Castro [birth year] [id number]  Referring provider:  Rusty Aus, MD [place] [facility] [place],  [place] [address]  Urological history:  1. High risk hematuria  -former smoker  -CTU 11/2014 - bilateral nephrolithiasis  -cysto 2016- refused  -cysto 2018 with stent removal - NED  -no reports of gross heme  -UA negative for micro heme    2. Nephrolithiasis  -Composition - 90% CaOx / 10% CaPO4  -pre 2017 MET x 3, SWL x 1  -right URS 2017  -SWL 2020   -KUB, 09/2021 -bilateral nephrolithiasis     HPI: Danny Castro is a 71 y.o.male who presents today for 6 month follow-up with KUB.  He states that he had an attack of what was similar to previous stones in the past.   He had pain in his groin area for a few minutes bilaterally two months ago.  He did not pass any fragments.  Patient denies any modifying or aggravating factors.  Patient denies any gross hematuria, dysuria or suprapubic/flank pain.  Patient denies any fevers, chills, nausea or vomiting.    KUB with bilateral nephrolithiasis with the largest stone in the right kidney measuring 13 mm and the largest stone in the left kidney measuring 5 mm.   No calcifications seen in the pelvis area suspicious for distal ureteral stones.     PMH: Past Medical History:  Diagnosis Date   Calculus of kidney 08/19/2013   History of kidney stones    Nephrolithiasis    OA (osteoarthritis)    Primary osteoarthritis of both knees 04/10/2014    Surgical History: Past Surgical History:  Procedure Laterality Date   COLONOSCOPY     CYSTOSCOPY W/ URETERAL STENT PLACEMENT Right 04/04/2016   Procedure: CYSTOSCOPY WITH STENT REPLACEMENT;  Surgeon: Nickie Retort, MD;  Location: [facility];  Service: Urology;  Laterality: Right;   CYSTOSCOPY WITH STENT PLACEMENT Right 03/13/2016   Procedure: CYSTOSCOPY WITH STENT PLACEMENT;  Surgeon: Nickie Retort, MD;  Location: [facility];   Service: Urology;  Laterality: Right;   EXTRACORPOREAL SHOCK WAVE LITHOTRIPSY Right 01/04/2015   Procedure: EXTRACORPOREAL SHOCK WAVE LITHOTRIPSY (ESWL);  Surgeon: Collier Flowers, MD;  Location: [facility];  Service: Urology;  Laterality: Right;   EXTRACORPOREAL SHOCK WAVE LITHOTRIPSY N/A 07/08/2018   Procedure: EXTRACORPOREAL SHOCK WAVE LITHOTRIPSY (ESWL);  Surgeon: Hollice Espy, MD;  Location: [facility];  Service: Urology;  Laterality: N/A;   EXTRACORPOREAL SHOCK WAVE LITHOTRIPSY Right 02/21/2021   Procedure: EXTRACORPOREAL SHOCK WAVE LITHOTRIPSY (ESWL);  Surgeon: Billey Co, MD;  Location: [facility];  Service: Urology;  Laterality: Right;   TONSILLECTOMY     URETEROSCOPY Right 03/13/2016   Procedure: URETEROSCOPY;  Surgeon: Nickie Retort, MD;  Location: [facility];  Service: Urology;  Laterality: Right;   URETEROSCOPY WITH HOLMIUM LASER LITHOTRIPSY Right 04/04/2016   Procedure: URETEROSCOPY WITH HOLMIUM LASER LITHOTRIPSY;  Surgeon: Nickie Retort, MD;  Location: [facility];  Service: Urology;  Laterality: Right;    Home Medications:  Allergies as of 09/19/2021       Reactions   Prednisone Anxiety        Medication List        Accurate as of September 19, 2021  6:38 PM. If you have any questions, ask your nurse or doctor.          doxycycline 100 MG tablet Commonly known as: VIBRA-TABS Take 100 mg by mouth 2 (two) times  daily.   ibuprofen 200 MG tablet Commonly known as: Motrin IB Take 3 tablets (600 mg total) by mouth every 6 (six) hours as needed.   naproxen sodium 220 MG tablet Commonly known as: ALEVE Take 440 mg by mouth 2 (two) times daily as needed (for pain.).   omeprazole 20 MG capsule Commonly known as: PRILOSEC Take 20 mg by mouth daily as needed (for acid reflux/indigestion).   ondansetron 4 MG tablet Commonly known as: Zofran Take 1 tablet (4 mg total) by mouth daily as needed for nausea or vomiting.   oxyCODONE-acetaminophen 5-325 MG  tablet Commonly known as: Percocet Take 1 tablet by mouth every 4 (four) hours as needed.   oxymetazoline 0.05 % nasal spray Commonly known as: AFRIN Place 1 spray into both nostrils at bedtime as needed for congestion.   Phenazopyridine HCl 97.5 MG Tabs Take 97.5 mg by mouth every 4 (four) hours as needed (burning).   triamcinolone ointment 0.5 % Commonly known as: KENALOG Apply topically 2 (two) times daily.        Allergies:  Allergies  Allergen Reactions   Prednisone Anxiety    Family History: Family History  Problem Relation Age of Onset   Prostate cancer Father    Nephrolithiasis Father     Social History:  reports that he quit smoking about 10 years ago. His smoking use included cigarettes. He has never used smokeless tobacco. He reports current alcohol use. He reports that he does not use drugs.   Physical Exam: BP 122/82   Pulse 83   Ht 5' 9" (1.753 m)   Wt 162 lb (73.5 kg)   BMI 23.92 kg/m   Constitutional:  Alert and oriented, No acute distress. HEENT: [place] AT, moist mucus membranes.  Trachea midline Cardiovascular: No clubbing, cyanosis, or edema. Respiratory: Normal respiratory effort, no increased work of breathing. Neurologic: Grossly intact, no focal deficits, moving all 4 extremities. Psychiatric: Normal mood and affect.  Laboratory Data: Urinalysis Benign I have reviewed the labs.   Pertinent Imaging: KUB bilateral nephrolithiasis I have independently reviewed the films.  See HPI.    Assessment & Plan:    1. Bilateral nephrolithiasis -KUB shows bilateral nephrolithiasis -Discussed that that bit of groin pain he experienced may have been the passage of a small stone that would have gone unnoticed -If he should continue to experience that pain in more intensity or frequency I recommend a CT renal stone study for further evaluation -he would like to follow up in 6 months  Return in about 6 months (around 03/22/2022) for KUB .  [place] [address] [place], [place] [address] [phone]

## 2021-09-20 LAB — URINALYSIS, COMPLETE
Bilirubin, UA: NEGATIVE
Glucose, UA: NEGATIVE
Ketones, UA: NEGATIVE
Leukocytes,UA: NEGATIVE
Nitrite, UA: NEGATIVE
Protein,UA: NEGATIVE
RBC, UA: NEGATIVE
Specific Gravity, UA: 1.03 (ref 1.005–1.030)
Urobilinogen, Ur: 0.2 mg/dL (ref 0.2–1.0)
pH, UA: 5 (ref 5.0–7.5)

## 2021-09-20 LAB — MICROSCOPIC EXAMINATION
Bacteria, UA: NONE SEEN
RBC, Urine: NONE SEEN /hpf (ref 0–2)

## 2021-12-04 ENCOUNTER — Emergency Department
Admission: EM | Admit: 2021-12-04 | Discharge: 2021-12-04 | Disposition: A | Discharge status: Home / Self Care | Payer: Medicare Other | Attending: Emergency Medicine | Admitting: Emergency Medicine

## 2021-12-04 ENCOUNTER — Emergency Department: Payer: Medicare Other

## 2021-12-04 ENCOUNTER — Other Ambulatory Visit: Payer: Self-pay

## 2021-12-04 ENCOUNTER — Encounter: Payer: Self-pay | Admitting: Emergency Medicine

## 2021-12-04 DIAGNOSIS — R1011 Right upper quadrant pain: Secondary | ICD-10-CM | POA: Diagnosis not present

## 2021-12-04 DIAGNOSIS — R12 Heartburn: Secondary | ICD-10-CM | POA: Insufficient documentation

## 2021-12-04 DIAGNOSIS — R110 Nausea: Secondary | ICD-10-CM | POA: Insufficient documentation

## 2021-12-04 LAB — URINALYSIS, MICROSCOPIC (REFLEX)

## 2021-12-04 LAB — CBC
HCT: 46.8 % (ref 39.0–52.0)
Hemoglobin: 15.3 g/dL (ref 13.0–17.0)
MCH: 29.9 pg (ref 26.0–34.0)
MCHC: 32.7 g/dL (ref 30.0–36.0)
MCV: 91.6 fL (ref 80.0–100.0)
Platelets: 261 10*3/uL (ref 150–400)
RBC: 5.11 MIL/uL (ref 4.22–5.81)
RDW: 15 % (ref 11.5–15.5)
WBC: 9.8 10*3/uL (ref 4.0–10.5)
nRBC: 0 % (ref 0.0–0.2)

## 2021-12-04 LAB — COMPREHENSIVE METABOLIC PANEL
ALT: 28 U/L (ref 0–44)
AST: 33 U/L (ref 15–41)
Albumin: 4.4 g/dL (ref 3.5–5.0)
Alkaline Phosphatase: 60 U/L (ref 38–126)
Anion gap: 7 (ref 5–15)
BUN: 25 mg/dL — ABNORMAL HIGH (ref 8–23)
CO2: 28 mmol/L (ref 22–32)
Calcium: 9.1 mg/dL (ref 8.9–10.3)
Chloride: 102 mmol/L (ref 98–111)
Creatinine, Ser: 0.92 mg/dL (ref 0.61–1.24)
GFR, Estimated: >60 mL/min (ref >60)
Glucose, Bld: 87 mg/dL (ref 70–99)
Potassium: 3.9 mmol/L (ref 3.5–5.1)
Sodium: 137 mmol/L (ref 135–145)
Total Bilirubin: 1.1 mg/dL (ref 0.3–1.2)
Total Protein: 7.7 g/dL (ref 6.5–8.1)

## 2021-12-04 LAB — URINALYSIS, ROUTINE W REFLEX MICROSCOPIC
Bilirubin Urine: NEGATIVE
Glucose, UA: NEGATIVE mg/dL
Ketones, ur: NEGATIVE mg/dL
Leukocytes,Ua: NEGATIVE
Nitrite: NEGATIVE
Protein, ur: NEGATIVE mg/dL
Specific Gravity, Urine: 1.025 (ref 1.005–1.030)
pH: 6 (ref 5.0–8.0)

## 2021-12-04 LAB — LIPASE, BLOOD: Lipase: 31 U/L (ref 11–51)

## 2021-12-04 MED ORDER — IOHEXOL 300 MG/ML  SOLN
100.0000 mL | Freq: Once | INTRAMUSCULAR | Status: AC | PRN
  Administered 2021-12-04: 100 mL via INTRAVENOUS

## 2021-12-04 MED ORDER — DICYCLOMINE HCL 10 MG PO CAPS
10.0000 mg | ORAL_CAPSULE | Freq: Once | ORAL | Status: AC
  Administered 2021-12-04: 10 mg via ORAL
  Filled 2021-12-04: qty 1

## 2021-12-04 MED ORDER — DICYCLOMINE HCL 10 MG PO CAPS: 10.0000 mg | ORAL_CAPSULE | Freq: Three times a day (TID) | ORAL | 0 refills | Status: DC

## 2021-12-04 MED ORDER — KETOROLAC TROMETHAMINE 30 MG/ML IJ SOLN
30.0000 mg | Freq: Once | INTRAMUSCULAR | Status: AC
  Administered 2021-12-04: 30 mg via INTRAVENOUS
  Filled 2021-12-04: qty 1

## 2021-12-04 MED ORDER — HYDROMORPHONE HCL 1 MG/ML IJ SOLN
0.5000 mg | Freq: Once | INTRAMUSCULAR | Status: AC
  Administered 2021-12-04: 0.5 mg via INTRAVENOUS
  Filled 2021-12-04: qty 0.5

## 2021-12-04 MED ORDER — SODIUM CHLORIDE 0.9 % IV BOLUS
1000.0000 mL | Freq: Once | INTRAVENOUS | Status: AC
  Administered 2021-12-04: 1000 mL via INTRAVENOUS

## 2021-12-04 NOTE — ED Notes (Signed)
Patient transported to CT 

## 2021-12-04 NOTE — ED Notes (Signed)
Pt verbalized understanding of d/c instructions, prescription and follow up care. Pt A&Ox4 ambulatory at d/c with independent steady gait. Pts wife will be driving him home.

## 2021-12-04 NOTE — ED Provider Notes (Signed)
[facility] Provider Note   Event Date/Time   First MD Initiated Contact with Patient 12/04/21 1546     (approximate) History  Abdominal Pain  HPI Danny Castro is a 71 y.o. male with past medical history of kidney stones and BPH who presents for right upper quadrant abdominal pain over the last day.  Patient states that he woke up with a small amount of pain that has been accompanied by nausea without vomiting and the feeling of heartburn.  Patient states that this pain has been getting worse throughout the day to the point where it is now a 9/10.  Patient denies any radiation of this pain.  Patient denies any recent travel or sick contacts.  Patient denies any food out of the ordinary. ROS: Patient currently denies any vision changes, tinnitus, difficulty speaking, facial droop, sore throat, chest pain, shortness of breath, vomiting/diarrhea, dysuria, or weakness/numbness/paresthesias in any extremity   Physical Exam  Triage Vital Signs: ED Triage Vitals  Enc Vitals Group     BP 12/04/21 1419 (!) 168/108     Pulse Rate 12/04/21 1419 76     Resp 12/04/21 1419 16     Temp 12/04/21 1419 98 F (36.7 C)     Temp Source 12/04/21 1419 Oral     SpO2 12/04/21 1419 99 %     Weight 12/04/21 1427 164 lb (74.4 kg)     Height 12/04/21 1427 5' 9.5" (1.765 m)     Head Circumference --      Peak Flow --      Pain Score 12/04/21 1426 9     Pain Loc --      Pain Edu? --      Excl. in GC? --    Most recent vital signs: Vitals:   12/04/21 1419 12/04/21 1605  BP: (!) 168/108 (!) 176/101  Pulse: 76 62  Resp: 16   Temp: 98 F (36.7 C)   SpO2: 99% 98%   General: Awake, oriented x4. CV:  Good peripheral perfusion.  Resp:  Normal effort.  Abd:  No distention.  Right upper quadrant tenderness to palpation Other:  Elderly Caucasian male sitting in chair in exam room in mild distress secondary to abdominal pain ED Results / Procedures / Treatments  Labs (all labs ordered  are listed, but only abnormal results are displayed) Labs Reviewed  COMPREHENSIVE METABOLIC PANEL - Abnormal; Notable for the following components:      Result Value   BUN 25 (*)    All other components within normal limits  URINALYSIS, ROUTINE W REFLEX MICROSCOPIC - Abnormal; Notable for the following components:   Hgb urine dipstick TRACE (*)    All other components within normal limits  URINALYSIS, MICROSCOPIC (REFLEX) - Abnormal; Notable for the following components:   Bacteria, UA RARE (*)    All other components within normal limits  LIPASE, BLOOD  CBC   EKG ED ECG REPORT I, Merwyn Katos, the attending physician, personally viewed and interpreted this ECG. Date: 12/04/2021 EKG Time: 1429 Rate: 66 Rhythm: normal sinus rhythm QRS Axis: normal Intervals: normal ST/T Wave abnormalities: normal Narrative Interpretation: no evidence of acute ischemia RADIOLOGY ED MD interpretation: CT of the abdomen and pelvis with IV contrast interpreted by me and shows bilateral nephrolithiasis and right renal pelvis calculus with no ureterolithiasis or obstructive uropathy.  There is no bladder calculi -Agree with radiology assessment Official radiology report(s): CT Abdomen Pelvis W Contrast  Result Date: 12/04/2021  CLINICAL DATA:  RIGHT upper quadrant pain EXAM: CT ABDOMEN AND PELVIS WITH CONTRAST TECHNIQUE: Multidetector CT imaging of the abdomen and pelvis was performed using the standard protocol following bolus administration of intravenous contrast. RADIATION DOSE REDUCTION: This exam was performed according to the departmental dose-optimization program which includes automated exposure control, adjustment of the mA and/or kV according to patient size and/or use of iterative reconstruction technique. CONTRAST:  135mL OMNIPAQUE IOHEXOL 300 MG/ML  SOLN COMPARISON:  None Available. FINDINGS: Lower chest: Lung bases are clear. Hepatobiliary: No focal hepatic lesion. No biliary duct dilatation.  Common bile duct is normal. Gallbladder appears normal. Pancreas: Pancreas is normal. No ductal dilatation. No pancreatic inflammation. Spleen: Spleen is diminutive. Adrenals/urinary tract: Several nonobstructing calculi within LEFT and RIGHT kidney. Larger nonobstructing calculus in the RIGHT renal pelvis measures 10 mm. No ureterolithiasis. No bladder calculi Stomach/Bowel: Stomach, small bowel, appendix, and cecum are normal. Multiple diverticula of the descending colon and sigmoid colon without acute inflammation. Vascular/Lymphatic: Abdominal aorta is normal caliber with atherosclerotic calcification. There is no retroperitoneal or periportal lymphadenopathy. No pelvic lymphadenopathy. Reproductive: Prostate unremarkable Other: No free fluid. Musculoskeletal: No aggressive osseous lesion. IMPRESSION: 1. Bilateral nephrolithiasis and RIGHT renal pelvis calculus. No ureterolithiasis or obstructive uropathy. 2. No bladder calculi. 3. Normal appendix. 4. Gallbladder normal by CT imaging. Electronically Signed   By: Suzy Bouchard M.D.   On: 12/04/2021 16:28   PROCEDURES: Critical Care performed: No Procedures MEDICATIONS ORDERED IN ED: Medications  dicyclomine (BENTYL) capsule 10 mg (has no administration in time range)  sodium chloride 0.9 % bolus 1,000 mL (1,000 mLs Intravenous New Bag/Given 12/04/21 1606)  ketorolac (TORADOL) 30 MG/ML injection 30 mg (30 mg Intravenous Given 12/04/21 1607)  HYDROmorphone (DILAUDID) injection 0.5 mg (0.5 mg Intravenous Given 12/04/21 1607)  iohexol (OMNIPAQUE) 300 MG/ML solution 100 mL (100 mLs Intravenous Contrast Given 12/04/21 1616)   IMPRESSION / MDM / ASSESSMENT AND PLAN / ED COURSE  I reviewed the triage vital signs and the nursing notes.                             The patient is on the cardiac monitor to evaluate for evidence of arrhythmia and/or significant heart rate changes. Patient's presentation is most consistent with acute presentation with potential  threat to life or bodily function. Patient presents for abdominal pain.  Differential diagnosis includes appendicitis, abdominal aortic aneurysm, surgical biliary disease, pancreatitis, SBO, mesenteric ischemia, serious intra-abdominal bacterial illness, genital torsion. Doubt atypical ACS. Based on history, physical exam, radiologic/laboratory evaluation, there is no red flag results or symptomatology requiring emergent intervention or need for admission at this time Pt tolerating PO. CT scan did show a large 10 mm kidney stone in the right renal pelvis that was described as nonobstructive.  Bedside right upper quadrant ultrasound did show evidence of cholelithiasis however patient did not have any CT evidence of gallbladder disease or transaminitis/hyperbilirubinemia As there is a possibility of patient having gallbladder colic, will give information for general surgical follow-up as well as instructions to follow-up with urology for further evaluation of this 10 mm stone Disposition: Patient will be discharged with strict return precautions and follow up with primary MD within 12-24 hours for further evaluation. Patient understands that this still may have an early presentation of an emergent medical condition such as appendicitis that will require a recheck.   FINAL CLINICAL IMPRESSION(S) / ED DIAGNOSES   Final diagnoses:  Right upper  quadrant abdominal pain   Rx / DC Orders   ED Discharge Orders          Ordered    dicyclomine (BENTYL) 10 MG capsule  3 times daily before meals & bedtime        12/04/21 1734           Note:  This document was prepared using Dragon voice recognition software and may include unintentional dictation errors.   Merwyn Katos, MD 12/04/21 [phone]

## 2021-12-04 NOTE — ED Triage Notes (Signed)
Pt via POV from home. Pt c/o RLQ pain that started this AM, states that he also have some nausea without vomiting. Tried OTC medications. Denies any abd surgeries. Denies fevers. Pt is A&Ox4 and NAD

## 2021-12-04 NOTE — ED Triage Notes (Signed)
First Nurse Note:  C/O RUQ abdominal pain x 1 day. Denies N/V.  States took some aleve last night, but did not change pain.  AAOx3.  Skin warm and dry. NAD

## 2021-12-05 ENCOUNTER — Other Ambulatory Visit: Payer: Self-pay | Admitting: General Surgery

## 2021-12-05 ENCOUNTER — Ambulatory Visit
Admission: RE | Admit: 2021-12-05 | Discharge: 2021-12-05 | Disposition: A | Discharge status: Home / Self Care | Payer: Medicare Other | Source: Ambulatory Visit | Attending: General Surgery | Admitting: General Surgery

## 2021-12-05 DIAGNOSIS — R1011 Right upper quadrant pain: Secondary | ICD-10-CM | POA: Diagnosis present

## 2021-12-09 ENCOUNTER — Other Ambulatory Visit: Payer: Self-pay | Admitting: Internal Medicine

## 2021-12-09 DIAGNOSIS — R1011 Right upper quadrant pain: Secondary | ICD-10-CM

## 2021-12-09 DIAGNOSIS — R1084 Generalized abdominal pain: Secondary | ICD-10-CM

## 2021-12-16 ENCOUNTER — Ambulatory Visit: Payer: Medicare Other

## 2021-12-19 ENCOUNTER — Ambulatory Visit
Admission: RE | Admit: 2021-12-19 | Discharge: 2021-12-19 | Disposition: A | Discharge status: Home / Self Care | Payer: Medicare Other | Source: Ambulatory Visit | Attending: Internal Medicine | Admitting: Internal Medicine

## 2021-12-19 DIAGNOSIS — R1084 Generalized abdominal pain: Secondary | ICD-10-CM | POA: Diagnosis present

## 2021-12-19 DIAGNOSIS — R1011 Right upper quadrant pain: Secondary | ICD-10-CM | POA: Diagnosis present

## 2021-12-19 MED ORDER — TECHNETIUM TC 99M MEBROFENIN IV KIT
5.0000 | PACK | Freq: Once | INTRAVENOUS | Status: AC | PRN
  Administered 2021-12-19: 5.21 via INTRAVENOUS

## 2022-03-24 ENCOUNTER — Other Ambulatory Visit: Payer: Self-pay | Admitting: Family Medicine

## 2022-03-24 DIAGNOSIS — N201 Calculus of ureter: Secondary | ICD-10-CM

## 2022-03-24 NOTE — Progress Notes (Signed)
03/30/22 10:45 PM   Danny Castro [birth year] [id number]  Referring provider:  Rusty Aus, MD [place] [facility] [place],  [place] [address]  Urological history:  1. High risk hematuria  -former smoker  -CTU 11/2014 - bilateral nephrolithiasis  -cysto 2016- refused  -cysto 2018 with stent removal - NED  -no reports of gross heme  -UA (02/2022) - negative for micro heme    2. Nephrolithiasis  -Composition - 90% CaOx / 10% CaPO4  -pre 2017 MET x 3, SWL x 1  -right URS 2017  -SWL 2020   -KUB (03/2022) - 1.2 cm right UPJ stone - two smaller stones project in the midpole region - small stones that project in the upper and lower poles of the left kidney   HPI: Danny Castro is a 72 y.o.male who presents today for 6 month follow-up with KUB.  PSA (02/2022) 0.76  CT 11/2021 noted a 1 cm right UPJ stone while he was in the ED for RUQ pain.  He was worked up for gallbladder pain.  [place] and HIDA scan were negative.    KUB 1 cm right UPJ stone w/ bilateral stones   UA yellow clear, specific gravity 1.020, trace blood, pH 5.0, 0-5 WBC's and 0-2 RBC's.  He is having intermittent, dull right flank pain that radiates to his right waist.  He also had one day of left groin pain.  Patient denies any modifying or aggravating factors.  Patient denies any gross hematuria, dysuria or suprapubic/flank pain.  Patient denies any fevers, chills, nausea or vomiting.    PMH: Past Medical History:  Diagnosis Date   Calculus of kidney 08/19/2013   History of kidney stones    Nephrolithiasis    OA (osteoarthritis)    Primary osteoarthritis of both knees 04/10/2014    Surgical History: Past Surgical History:  Procedure Laterality Date   COLONOSCOPY     CYSTOSCOPY W/ URETERAL STENT PLACEMENT Right 04/04/2016   Procedure: CYSTOSCOPY WITH STENT REPLACEMENT;  Surgeon: Nickie Retort, MD;  Location: [facility];  Service: Urology;  Laterality: Right;   CYSTOSCOPY WITH  STENT PLACEMENT Right 03/13/2016   Procedure: CYSTOSCOPY WITH STENT PLACEMENT;  Surgeon: Nickie Retort, MD;  Location: [facility];  Service: Urology;  Laterality: Right;   EXTRACORPOREAL SHOCK WAVE LITHOTRIPSY Right 01/04/2015   Procedure: EXTRACORPOREAL SHOCK WAVE LITHOTRIPSY (ESWL);  Surgeon: Collier Flowers, MD;  Location: [facility];  Service: Urology;  Laterality: Right;   EXTRACORPOREAL SHOCK WAVE LITHOTRIPSY N/A 07/08/2018   Procedure: EXTRACORPOREAL SHOCK WAVE LITHOTRIPSY (ESWL);  Surgeon: Hollice Espy, MD;  Location: [facility];  Service: Urology;  Laterality: N/A;   EXTRACORPOREAL SHOCK WAVE LITHOTRIPSY Right 02/21/2021   Procedure: EXTRACORPOREAL SHOCK WAVE LITHOTRIPSY (ESWL);  Surgeon: Billey Co, MD;  Location: [facility];  Service: Urology;  Laterality: Right;   TONSILLECTOMY     URETEROSCOPY Right 03/13/2016   Procedure: URETEROSCOPY;  Surgeon: Nickie Retort, MD;  Location: [facility];  Service: Urology;  Laterality: Right;   URETEROSCOPY WITH HOLMIUM LASER LITHOTRIPSY Right 04/04/2016   Procedure: URETEROSCOPY WITH HOLMIUM LASER LITHOTRIPSY;  Surgeon: Nickie Retort, MD;  Location: [facility];  Service: Urology;  Laterality: Right;    Home Medications:  Allergies as of 03/25/2022       Reactions   Prednisone Anxiety        Medication List        Accurate as of March 25, 2022 11:59 PM. If you  have any questions, ask your nurse or doctor.          dicyclomine 10 MG capsule Commonly known as: BENTYL Take 1 capsule (10 mg total) by mouth 4 (four) times daily -  before meals and at bedtime.   doxycycline 100 MG tablet Commonly known as: VIBRA-TABS Take 100 mg by mouth 2 (two) times daily.   ibuprofen 200 MG tablet Commonly known as: Motrin IB Take 3 tablets (600 mg total) by mouth every 6 (six) hours as needed.   naproxen sodium 220 MG tablet Commonly known as: ALEVE Take 440 mg by mouth 2 (two) times daily as needed (for pain.).   omeprazole 20 MG  capsule Commonly known as: PRILOSEC Take 20 mg by mouth daily as needed (for acid reflux/indigestion).   oxyCODONE-acetaminophen 5-325 MG tablet Commonly known as: Percocet Take 1 tablet by mouth every 4 (four) hours as needed.   oxymetazoline 0.05 % nasal spray Commonly known as: AFRIN Place 1 spray into both nostrils at bedtime as needed for congestion.   Phenazopyridine HCl 97.5 MG Tabs Take 97.5 mg by mouth every 4 (four) hours as needed (burning).   triamcinolone ointment 0.5 % Commonly known as: KENALOG Apply topically 2 (two) times daily.        Allergies:  Allergies  Allergen Reactions   Prednisone Anxiety    Family History: Family History  Problem Relation Age of Onset   Prostate cancer Father    Nephrolithiasis Father     Social History:  reports that he quit smoking about 11 years ago. His smoking use included cigarettes. He has never used smokeless tobacco. He reports current alcohol use. He reports that he does not use drugs.   Physical Exam: BP (!) 152/93   Pulse 80   Ht 5\' 7"  (1.702 m)   Wt 167 lb (75.8 kg)   BMI 26.16 kg/m   Constitutional:  Well nourished. Alert and oriented, No acute distress. HEENT: [place] AT, moist mucus membranes.  Trachea midline Cardiovascular: No clubbing, cyanosis, or edema. Respiratory: Normal respiratory effort, no increased work of breathing. Neurologic: Grossly intact, no focal deficits, moving all 4 extremities. Psychiatric: Normal mood and affect.   Laboratory Data: Urinalysis (02/2022) unremarkable Serum creatinine (02/2022) 0.9 Hemoglobin A1c (02/2022) 5.6 I have reviewed the labs.    Pertinent Imaging: Narrative & Impression  CLINICAL DATA:  RIGHT upper quadrant pain   EXAM: CT ABDOMEN AND PELVIS WITH CONTRAST   TECHNIQUE: Multidetector CT imaging of the abdomen and pelvis was performed using the standard protocol following bolus administration of intravenous contrast.   RADIATION DOSE REDUCTION:  This exam was performed according to the departmental dose-optimization program which includes automated exposure control, adjustment of the mA and/or kV according to patient size and/or use of iterative reconstruction technique.   CONTRAST:  03/2022 OMNIPAQUE IOHEXOL 300 MG/ML  SOLN   COMPARISON:  None Available.   FINDINGS: Lower chest: Lung bases are clear.   Hepatobiliary: No focal hepatic lesion. No biliary duct dilatation. Common bile duct is normal. Gallbladder appears normal.   Pancreas: Pancreas is normal. No ductal dilatation. No pancreatic inflammation.   Spleen: Spleen is diminutive.   Adrenals/urinary tract: Several nonobstructing calculi within LEFT and RIGHT kidney. Larger nonobstructing calculus in the RIGHT renal pelvis measures 10 mm. No ureterolithiasis. No bladder calculi   Stomach/Bowel: Stomach, small bowel, appendix, and cecum are normal. Multiple diverticula of the descending colon and sigmoid colon without acute inflammation.   Vascular/Lymphatic: Abdominal aorta is normal  caliber with atherosclerotic calcification. There is no retroperitoneal or periportal lymphadenopathy. No pelvic lymphadenopathy.   Reproductive: Prostate unremarkable   Other: No free fluid.   Musculoskeletal: No aggressive osseous lesion.   IMPRESSION: 1. Bilateral nephrolithiasis and RIGHT renal pelvis calculus. No ureterolithiasis or obstructive uropathy. 2. No bladder calculi. 3. Normal appendix. 4. Gallbladder normal by CT imaging.     Electronically Signed   By: Genevive Bi M.D.   On: 12/04/2021 16:28  SD < 1500 HU,  SSD < 15 cm   Narrative & Impression  CLINICAL DATA:  History of right ureteral stone. Lithotripsy 1 year ago.   EXAM: ABDOMEN - 1 VIEW   COMPARISON:  09/19/2021 and older exams.  CT, 12/04/2021.   FINDINGS: 12 mm stone projects along the medial margin of the right renal shadow, unchanged. Two smaller stones project in the midpole  region of the right kidney. There are small stones that project in the upper and lower poles of the left kidney. These are all stable.   No convincing ureteral stone.   Normal bowel gas pattern.   No acute skeletal abnormality. Stable lumbar spine disc degenerative changes.   IMPRESSION: 1. No acute findings.  No evidence of a ureteral stone. 2. Stable bilateral renal stones as detailed.     Electronically Signed   By: Amie Portland M.D.   On: 03/26/2022 08:52  I have independently reviewed the films.  See HPI.    Assessment & Plan:    1. Right UPJ stone -We discussed various treatment options for urolithiasis including observation with or without medical expulsive therapy, shockwave lithotripsy (SWL), ureteroscopy and laser lithotripsy with stent placement. -We discussed that management is based on stone size, location, density, patient co-morbidities, and patient preference.  -Stones <26mm in size have a >80% spontaneous passage rate. Data surrounding the use of tamsulosin for medical expulsive therapy is controversial, but meta analyses suggests it is most efficacious for distal stones between 5-43mm in size. Possible side effects include dizziness/lightheadedness -SWL has a lower stone free rate in a single procedure, but also a lower complication rate compared to ureteroscopy and avoids a stent and associated stent related symptoms. Possible complications include renal hematoma, steinstrasse, and need for additional treatment. -Ureteroscopy with laser lithotripsy and stent placement has a higher stone free rate than SWL in a single procedure, however increased complication rate including possible infection, ureteral injury, bleeding, and stent related morbidity. Common stent related symptoms include dysuria, urgency/frequency, and flank pain -he has had ureteral stents placed in the past and he found them intolerable -I explained that he would have the greatest chance of being stone  free if he underwent URS, but he deferred -I advised him that it is likely that ESWL may not be successful with the first attempt and he may need a second ESWL to clear him of the right UPJ stone -I also advised him that he may have to undergo stent placement if his stone fragments will not pass after ESWL -he voiced his understanding and wants to proceed with ESWL -Explained to the patient that with ESWL, shock waves are focused on the stone using X-rays to pinpoint the stone.  The shock waves are fired repeatedly which usually causes the stone to break into small pieces which pass out in the urine over the next few weeks.  They will go home that day and may be able to resume normal activities in three days.  They should be given a strainer and  they need to collect any fragments/sediments that they pass for analysis.   Risks involved with the procedure consist of bruising of the skin and kidney, possible long-term kidney damage, development of HTN, damage to the bowel, spleen, liver, pancreas, male organs or lungs, hematuria, hematuria serious enough to require transfusion or surgical repair, formation of a hematoma, infection or sepsis.  If the stone is too large or dense, it may not break apart or break apart in large pieces and cause "Steinstrasse."  If this happens, it would result in the need for another procedure (likely URS/LL/stent placement).  IV sedation is typically used, but in rare instances general anesthesia is used with the risk of irregular heart beat, irregular BP, stroke, MI, CVA, paralysis, coma and/or death.  -right ESWL   2. Left groin pain -not likely caused by a stone, but in an abundance of caution, will get another CT renal stone study to ensure he does not have any left ureteral stones which would make him ineligible for ESWL     3. Bilateral nephrolithiasis -KUB shows bilateral nephrolithiasis -explained that only the right UPJ stone will be treated during the  ESWL  Return for CT renal stone study/right ESWL .  [facility] [address], [address] [place], [place] [address] [phone]

## 2022-03-25 ENCOUNTER — Encounter: Payer: Self-pay | Admitting: Urology

## 2022-03-25 ENCOUNTER — Ambulatory Visit
Admission: RE | Admit: 2022-03-25 | Discharge: 2022-03-25 | Disposition: A | Discharge status: Home / Self Care | Payer: Medicare Other | Source: Ambulatory Visit | Attending: Urology | Admitting: Urology

## 2022-03-25 ENCOUNTER — Other Ambulatory Visit: Payer: Self-pay | Admitting: Urology

## 2022-03-25 ENCOUNTER — Ambulatory Visit: Payer: Medicare Other | Admitting: Urology

## 2022-03-25 VITALS — BP 152/93 | HR 80 | Ht 67.0 in | Wt 167.0 lb

## 2022-03-25 DIAGNOSIS — R1032 Left lower quadrant pain: Secondary | ICD-10-CM

## 2022-03-25 DIAGNOSIS — N201 Calculus of ureter: Secondary | ICD-10-CM

## 2022-03-25 DIAGNOSIS — N200 Calculus of kidney: Secondary | ICD-10-CM | POA: Diagnosis not present

## 2022-03-25 LAB — URINALYSIS, COMPLETE
Bilirubin, UA: NEGATIVE
Glucose, UA: NEGATIVE
Ketones, UA: NEGATIVE
Leukocytes,UA: NEGATIVE
Nitrite, UA: NEGATIVE
Protein,UA: NEGATIVE
Specific Gravity, UA: 1.02 (ref 1.005–1.030)
Urobilinogen, Ur: 0.2 mg/dL (ref 0.2–1.0)
pH, UA: 5 (ref 5.0–7.5)

## 2022-03-25 LAB — MICROSCOPIC EXAMINATION
Bacteria, UA: NONE SEEN
Epithelial Cells (non renal): NONE SEEN /hpf (ref 0–10)

## 2022-03-25 NOTE — Progress Notes (Signed)
ESWL ORDER FORM  Expected date of procedure: 04/10/2022  Surgeon: Nickolas Madrid, MD  Post op standing: 2-4wk follow up w/KUB prior  Anticoagulation/Aspirin/NSAID standing order: Hold all 72 hours prior  Anesthesia standing order: MAC  VTE standing: SCD's  Dx: Right Ureteral Stone  Procedure: right Extracorporeal shock wave lithotripsy  CPT : 16945  Standing Order Set:   *NPO after mn, KUB  *NS 170m/hr, Keflex 5077mPO, Benadryl 2535mO, Valium 34m31m, Zofran 4mg 23m   Medications if other than standing orders:   Ancef 2gm IV

## 2022-03-27 LAB — CULTURE, URINE COMPREHENSIVE

## 2022-04-07 ENCOUNTER — Ambulatory Visit
Admission: RE | Admit: 2022-04-07 | Discharge: 2022-04-07 | Disposition: A | Discharge status: Home / Self Care | Payer: Medicare Other | Source: Ambulatory Visit | Attending: Urology | Admitting: Urology

## 2022-04-07 DIAGNOSIS — N201 Calculus of ureter: Secondary | ICD-10-CM | POA: Insufficient documentation

## 2022-04-08 ENCOUNTER — Other Ambulatory Visit: Payer: Self-pay | Admitting: Family Medicine

## 2022-04-08 DIAGNOSIS — N201 Calculus of ureter: Secondary | ICD-10-CM

## 2022-04-10 ENCOUNTER — Ambulatory Visit: Admission: RE | Admit: 2022-04-10 | Payer: Medicare Other | Source: Home / Self Care | Admitting: Urology

## 2022-04-10 ENCOUNTER — Encounter: Admission: RE | Payer: Self-pay | Source: Home / Self Care

## 2022-04-10 SURGERY — LITHOTRIPSY, ESWL
Anesthesia: General | Laterality: Right

## 2022-04-14 ENCOUNTER — Other Ambulatory Visit: Payer: Medicare Other

## 2022-06-02 IMAGING — CR DG ABDOMEN 1V
1 series · 2 of 2 positions shown · non-contrast
Comparison: Radiograph dated 02/25/2021 and CT dated 02/19/2021.

CLINICAL DATA: Right kidney stones.

EXAM:
ABDOMEN - 1 VIEW

[Series 1: dg abd 1 view · 0.14mm/px · 2 of 2 slices shown]
[im 1/2]
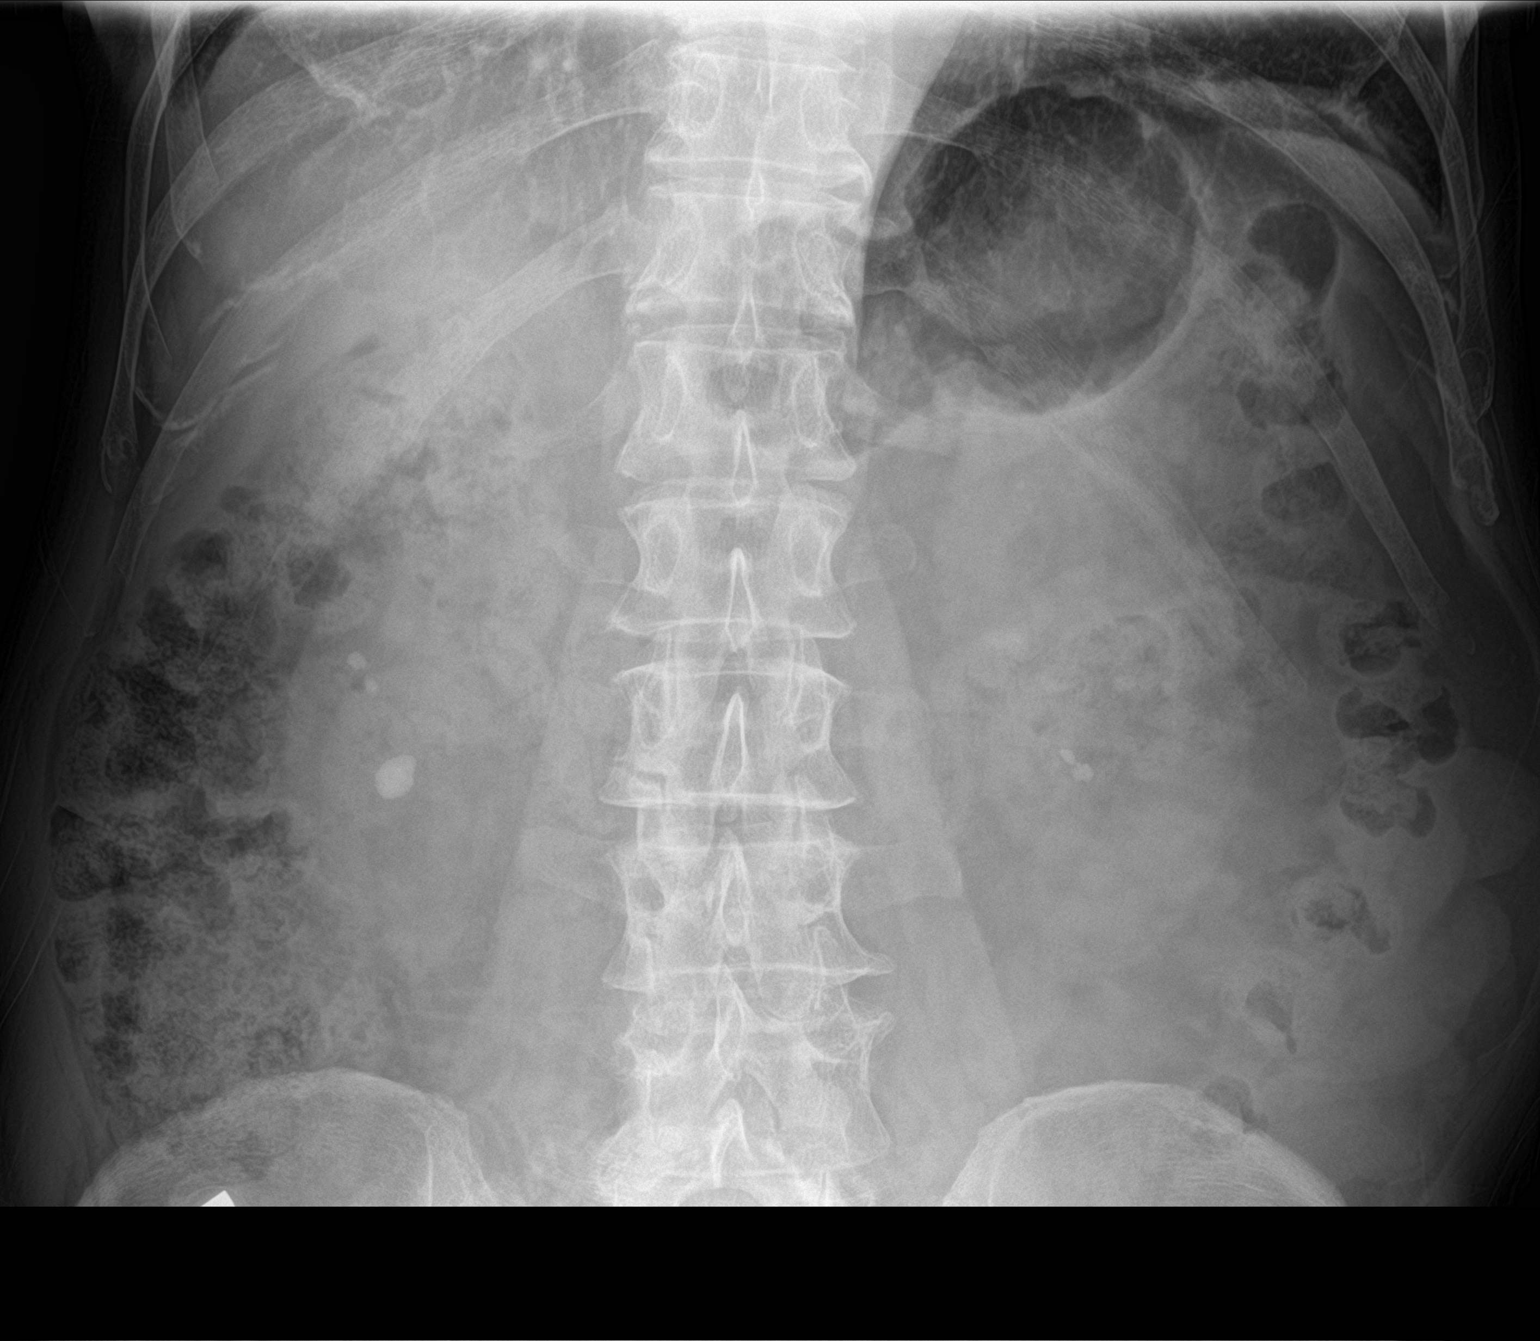
[im 2/2]
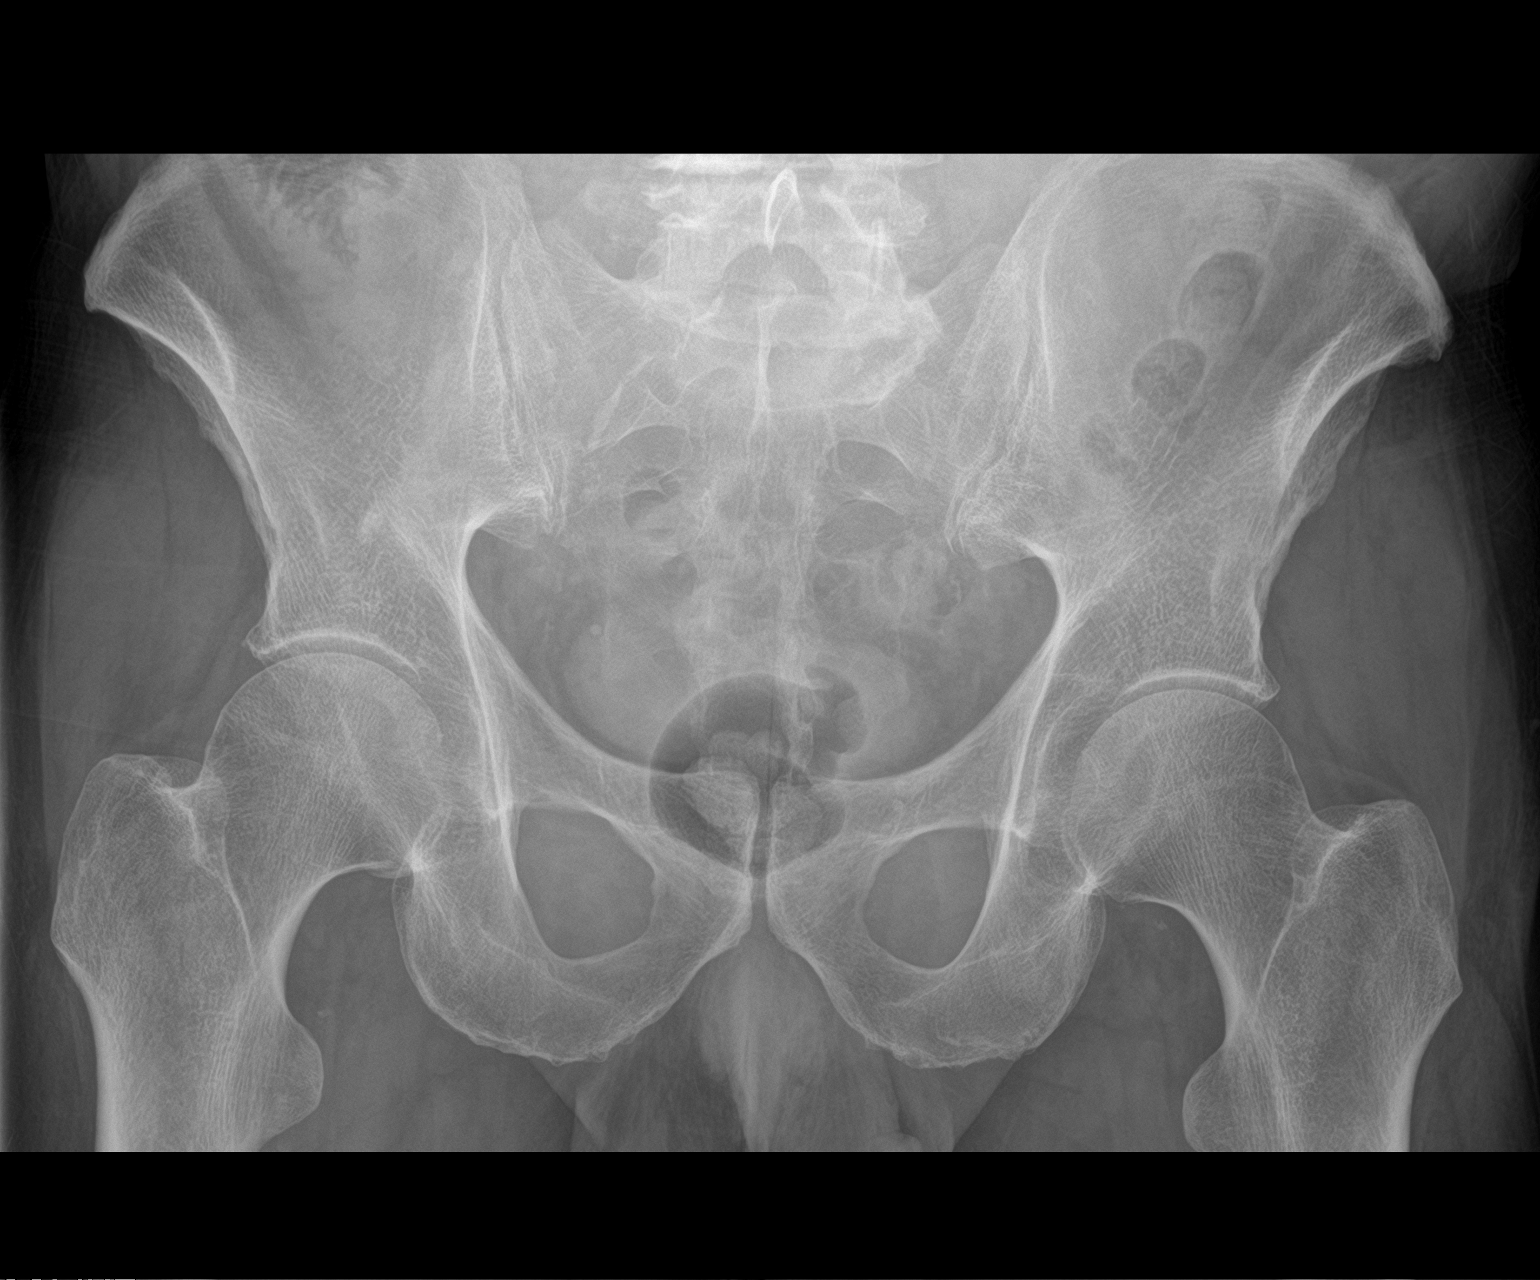

[2 of 2 positions shown; findings below may reference images not displayed]

FINDINGS: Three right renal calculi with the largest measuring approximately
13 mm in the lower pole of the right kidney as seen on the CT. 2
adjacent stones over the inferior pole of the left kidney similar to
prior CT with combined length of approximately 1 cm. Additional tiny
left renal upper pole calculi are faintly noted. No definite stone
noted along the expected course of the ureters.

No bowel dilatation or evidence of obstruction. No free air. No
acute osseous pathology.
IMPRESSION: Bilateral renal calculi as described.

## 2022-06-09 NOTE — H&P (View-Only) (Signed)
06/10/22 2:36 PM   Danny Castro [birth year] [id number]  Referring provider:  Rusty Aus, MD [facility] [place],  [place] [address]  Urological history:  1. High risk hematuria  -former smoker  -CTU 11/2014 - bilateral nephrolithiasis  -cysto 2016- refused  -cysto 2018 with stent removal - NED  -no reports of gross heme  -UA (02/2022) - negative for micro heme    2. Nephrolithiasis  -Composition - 90% CaOx / 10% CaPO4  -pre 2017 MET x 3, SWL x 1  -right URS 2017  -SWL 2020   -CT renal stone (03/2022) - Bilateral nephrolithiasis. No evidence of ureteral calculi, hydronephrosis, or other acute findings.  HPI: Danny Castro is a 72 y.o.male who presents today for 2 month follow-up with KUB.  Back in January, he was having some right-sided flank pain and was tentatively scheduled to undergo ESWL, but his right UPJ stone migrated back down into his kidney.  At that time, he was not experiencing any further pain and he decided to postpone any definitive treatment for his stone.  He has not had any further flank pain or passage of fragments.  Patient denies any modifying or aggravating factors.  Patient denies any gross hematuria, dysuria or suprapubic/flank pain.  Patient denies any fevers, chills, nausea or vomiting.    UA yellow clear, specific gravity 1.025, 1+ blood, pH 5.5, 0-5 WBCs, 3-10 RBCs, 0-10 epithelial cells and a few bacteria.    KUB 1.5 cm right UPJ stone and left renal stones   PMH: Past Medical History:  Diagnosis Date   Calculus of kidney 08/19/2013   History of kidney stones    Nephrolithiasis    OA (osteoarthritis)    Primary osteoarthritis of both knees 04/10/2014    Surgical History: Past Surgical History:  Procedure Laterality Date   COLONOSCOPY     CYSTOSCOPY W/ URETERAL STENT PLACEMENT Right 04/04/2016   Procedure: CYSTOSCOPY WITH STENT REPLACEMENT;  Surgeon: Nickie Retort, MD;  Location: [facility];   Service: Urology;  Laterality: Right;   CYSTOSCOPY WITH STENT PLACEMENT Right 03/13/2016   Procedure: CYSTOSCOPY WITH STENT PLACEMENT;  Surgeon: Nickie Retort, MD;  Location: [facility];  Service: Urology;  Laterality: Right;   EXTRACORPOREAL SHOCK WAVE LITHOTRIPSY Right 01/04/2015   Procedure: EXTRACORPOREAL SHOCK WAVE LITHOTRIPSY (ESWL);  Surgeon: Collier Flowers, MD;  Location: [facility];  Service: Urology;  Laterality: Right;   EXTRACORPOREAL SHOCK WAVE LITHOTRIPSY N/A 07/08/2018   Procedure: EXTRACORPOREAL SHOCK WAVE LITHOTRIPSY (ESWL);  Surgeon: Hollice Espy, MD;  Location: [facility];  Service: Urology;  Laterality: N/A;   EXTRACORPOREAL SHOCK WAVE LITHOTRIPSY Right 02/21/2021   Procedure: EXTRACORPOREAL SHOCK WAVE LITHOTRIPSY (ESWL);  Surgeon: Billey Co, MD;  Location: [facility];  Service: Urology;  Laterality: Right;   TONSILLECTOMY     URETEROSCOPY Right 03/13/2016   Procedure: URETEROSCOPY;  Surgeon: Nickie Retort, MD;  Location: [facility];  Service: Urology;  Laterality: Right;   URETEROSCOPY WITH HOLMIUM LASER LITHOTRIPSY Right 04/04/2016   Procedure: URETEROSCOPY WITH HOLMIUM LASER LITHOTRIPSY;  Surgeon: Nickie Retort, MD;  Location: [facility];  Service: Urology;  Laterality: Right;    Home Medications:  Allergies as of 06/10/2022       Reactions   Prednisone Anxiety        Medication List        Accurate as of June 10, 2022  2:36 PM. If you have any questions, ask your nurse or  doctor.          STOP taking these medications    dicyclomine 10 MG capsule Commonly known as: BENTYL Stopped by: Ishaaq Penna, PA-C   doxycycline 100 MG tablet Commonly known as: VIBRA-TABS Stopped by: Loise Esguerra, PA-C   ibuprofen 200 MG tablet Commonly known as: Motrin IB Stopped by: Delphin Funes, PA-C   naproxen sodium 220 MG tablet Commonly known as: ALEVE Stopped by: Aquarius Tremper, PA-C   omeprazole 20 MG capsule Commonly known as:  PRILOSEC Stopped by: Casimiro Lienhard, PA-C   oxymetazoline 0.05 % nasal spray Commonly known as: AFRIN Stopped by: Jameila Keeny, PA-C   Phenazopyridine HCl 97.5 MG Tabs Stopped by: Neno Hohensee, PA-C   triamcinolone ointment 0.5 % Commonly known as: KENALOG Stopped by: Reganne Messerschmidt, PA-C       TAKE these medications    meloxicam 15 MG tablet Commonly known as: MOBIC Take 15 mg by mouth daily.   oxyCODONE-acetaminophen 5-325 MG tablet Commonly known as: Percocet Take 1 tablet by mouth every 4 (four) hours as needed.        Allergies:  Allergies  Allergen Reactions   Prednisone Anxiety    Family History: Family History  Problem Relation Age of Onset   Prostate cancer Father    Nephrolithiasis Father     Social History:  reports that he quit smoking about 11 years ago. His smoking use included cigarettes. He has never used smokeless tobacco. He reports current alcohol use. He reports that he does not use drugs.   Physical Exam: BP (!) 168/100   Pulse 93   Ht 5' 9.5" (1.765 m)   Wt 170 lb 6.4 oz (77.3 kg)   BMI 24.80 kg/m   Constitutional:  Well nourished. Alert and oriented, No acute distress. HEENT: [place] AT, moist mucus membranes.  Trachea midline Cardiovascular: No clubbing, cyanosis, or edema. Respiratory: Normal respiratory effort, no increased work of breathing. Neurologic: Grossly intact, no focal deficits, moving all 4 extremities. Psychiatric: Normal mood and affect.   Laboratory Data: Urinalysis  Component     Latest Ref Rng 06/10/2022  Glucose, UA     Negative  Negative   Specific Gravity, UA     1.005 - 1.030  1.025   pH, UA     5.0 - 7.5  5.5   Color, UA     Yellow  Yellow   Appearance Ur     Clear  Clear   Leukocytes,UA     Negative  Negative   Protein,UA     Negative/Trace  Negative   Ketones, UA     Negative  Negative   RBC, UA     Negative  1+ !   Bilirubin, UA     Negative  Negative   Urobilinogen, Ur     0.2 -  1.0 mg/dL 0.2   Nitrite, UA     Negative  Negative   Microscopic Examination See below:     Legend: ! Abnormal  Component     Latest Ref Rng 06/10/2022  WBC, UA     0 - 5 /hpf 0-5   Bacteria, UA     None seen/Few  Few   RBC, Urine     0 - 2 /hpf 3-10 !   Epithelial Cells (non renal)     0 - 10 /hpf 0-10     Legend: ! Abnormal I have reviewed the labs.    Pertinent Imaging: Narrative & Impression  CLINICAL DATA:  Nephrolithiasis.  EXAM: ABDOMEN - 1 VIEW   COMPARISON:  None Available.   FINDINGS: Bilateral calcifications overlying the expected location of the kidneys measuring up to 1.5 cm on the right. Unremarkable bowel gas pattern.   IMPRESSION: Findings are consistent with bilateral nephrolithiasis.     Electronically Signed   By: Sammie Bench M.D.   On: 06/12/2022 16:19     I have independently reviewed the films.  See HPI.    Assessment & Plan:    1. Right UPJ stone -I expressed my concern that with the large stone ball valving back-and-forth that he may end up having an incident of intense right renal colic while he is away from home working at a job site, an AKI and/or become septic and he agrees and will move forward with definitve treatment for the stone -We discussed various treatment options for urolithiasis including observation with or without medical expulsive therapy, shockwave lithotripsy (SWL), ureteroscopy and laser lithotripsy with stent placement. -We discussed that management is based on stone size, location, density, patient co-morbidities, and patient preference.  -Stones <82mm in size have a >80% spontaneous passage rate. Data surrounding the use of tamsulosin for medical expulsive therapy is controversial, but meta analyses suggests it is most efficacious for distal stones between 5-58mm in size. Possible side effects include dizziness/lightheadedness -SWL has a lower stone free rate in a single procedure, but also a lower  complication rate compared to ureteroscopy and avoids a stent and associated stent related symptoms. Possible complications include renal hematoma, steinstrasse, and need for additional treatment. -Ureteroscopy with laser lithotripsy and stent placement has a higher stone free rate than SWL in a single procedure, however increased complication rate including possible infection, ureteral injury, bleeding, and stent related morbidity. Common stent related symptoms include dysuria, urgency/frequency, and flank pain. -Discussed with patient that due to the density of the stone but I would recommend ureteroscopy as it is likely the best chance he has a becoming stone free in a single procedure although is always possible that we will need to return at a later date if we are unsuccessful in getting him stone free at the first attempt - explained to the patient how the URS procedure is performed and the risks involved -informed the patient that they will have an ureteral stent, which will remain in place for approximately 3-10 days and can be associated with flank pain, bladder pain, dysuria, urgency, frequency, urinary leakage, and gross hematuria. - stent may be removed in the office with a cystoscope or patient may be instructed to remove the stent themselves by the string and that is decided on the day of the procedure - residual stones within the kidney or ureter may be present after the procedure and may need to have these addressed at a different encounter - injury to the ureter is the most common intra-operative risk, it may result in an open procedure to correct the defect - infection and bleeding are also risks - explained the risks of general anesthesia, such as: MI, CVA, paralysis, coma and/or death. - advised to contact our office or seek treatment in the ED if becomes febrile or pain/ vomiting are difficult control in order to arrange for emergent/urgent intervention -His questions were answered to  his satisfaction, he understands and we are going to proceed with right ureteroscopy laser lithotripsy and ureteral stent placement     2. Bilateral nephrolithiasis -will address the right pelvic stone with URS/LL/stent placement   Return for right URS/LL/ureteral stent  placement.  [facility] [address] [place], [place] [address] [phone]

## 2022-06-09 NOTE — Progress Notes (Unsigned)
06/10/22 2:36 PM   Danny Castro [birth year] [id number]  Referring provider:  Rusty Aus, MD [facility] [place],  [place] [address]  Urological history:  1. High risk hematuria  -former smoker  -CTU 11/2014 - bilateral nephrolithiasis  -cysto 2016- refused  -cysto 2018 with stent removal - NED  -no reports of gross heme  -UA (02/2022) - negative for micro heme    2. Nephrolithiasis  -Composition - 90% CaOx / 10% CaPO4  -pre 2017 MET x 3, SWL x 1  -right URS 2017  -SWL 2020   -CT renal stone (03/2022) - Bilateral nephrolithiasis. No evidence of ureteral calculi, hydronephrosis, or other acute findings. HPI: Danny Castro is a 72 y.o.male who presents today for 2 month follow-up with KUB.  Back in January, he was having some right-sided flank pain and was tentatively scheduled to undergo ESWL, but his right UPJ stone migrated back down into his kidney.  At that time, he was not experiencing any further pain and he decided to postpone any definitive treatment for his stone.  He has not had any further flank pain or passage of fragments.  Patient denies any modifying or aggravating factors.  Patient denies any gross hematuria, dysuria or suprapubic/flank pain.  Patient denies any fevers, chills, nausea or vomiting.    UA yellow clear, specific gravity 1.025, 1+ blood, pH 5.5, 0-5 WBCs, 3-10 RBCs, 0-10 epithelial cells and a few bacteria.    KUB ***  PMH: Past Medical History:  Diagnosis Date   Calculus of kidney 08/19/2013   History of kidney stones    Nephrolithiasis    OA (osteoarthritis)    Primary osteoarthritis of both knees 04/10/2014    Surgical History: Past Surgical History:  Procedure Laterality Date   COLONOSCOPY     CYSTOSCOPY W/ URETERAL STENT PLACEMENT Right 04/04/2016   Procedure: CYSTOSCOPY WITH STENT REPLACEMENT;  Surgeon: Nickie Retort, MD;  Location: [facility];  Service: Urology;  Laterality: Right;    CYSTOSCOPY WITH STENT PLACEMENT Right 03/13/2016   Procedure: CYSTOSCOPY WITH STENT PLACEMENT;  Surgeon: Nickie Retort, MD;  Location: [facility];  Service: Urology;  Laterality: Right;   EXTRACORPOREAL SHOCK WAVE LITHOTRIPSY Right 01/04/2015   Procedure: EXTRACORPOREAL SHOCK WAVE LITHOTRIPSY (ESWL);  Surgeon: Collier Flowers, MD;  Location: [facility];  Service: Urology;  Laterality: Right;   EXTRACORPOREAL SHOCK WAVE LITHOTRIPSY N/A 07/08/2018   Procedure: EXTRACORPOREAL SHOCK WAVE LITHOTRIPSY (ESWL);  Surgeon: Hollice Espy, MD;  Location: [facility];  Service: Urology;  Laterality: N/A;   EXTRACORPOREAL SHOCK WAVE LITHOTRIPSY Right 02/21/2021   Procedure: EXTRACORPOREAL SHOCK WAVE LITHOTRIPSY (ESWL);  Surgeon: Billey Co, MD;  Location: [facility];  Service: Urology;  Laterality: Right;   TONSILLECTOMY     URETEROSCOPY Right 03/13/2016   Procedure: URETEROSCOPY;  Surgeon: Nickie Retort, MD;  Location: [facility];  Service: Urology;  Laterality: Right;   URETEROSCOPY WITH HOLMIUM LASER LITHOTRIPSY Right 04/04/2016   Procedure: URETEROSCOPY WITH HOLMIUM LASER LITHOTRIPSY;  Surgeon: Nickie Retort, MD;  Location: [facility];  Service: Urology;  Laterality: Right;    Home Medications:  Allergies as of 06/10/2022       Reactions   Prednisone Anxiety        Medication List        Accurate as of June 10, 2022  2:36 PM. If you have any questions, ask your nurse or doctor.  STOP taking these medications    dicyclomine 10 MG capsule Commonly known as: BENTYL Stopped by: Viana Sleep, PA-C   doxycycline 100 MG tablet Commonly known as: VIBRA-TABS Stopped by: Jayleigh Notarianni, PA-C   ibuprofen 200 MG tablet Commonly known as: Motrin IB Stopped by: Romain Erion, PA-C   naproxen sodium 220 MG tablet Commonly known as: ALEVE Stopped by: Bilaal Leib, PA-C   omeprazole 20 MG capsule Commonly known as: PRILOSEC Stopped by: Nico Syme, PA-C    oxymetazoline 0.05 % nasal spray Commonly known as: AFRIN Stopped by: Gregory Dowe, PA-C   Phenazopyridine HCl 97.5 MG Tabs Stopped by: Malky Rudzinski, PA-C   triamcinolone ointment 0.5 % Commonly known as: KENALOG Stopped by: Fateh Kindle, PA-C       TAKE these medications    meloxicam 15 MG tablet Commonly known as: MOBIC Take 15 mg by mouth daily.   oxyCODONE-acetaminophen 5-325 MG tablet Commonly known as: Percocet Take 1 tablet by mouth every 4 (four) hours as needed.        Allergies:  Allergies  Allergen Reactions   Prednisone Anxiety    Family History: Family History  Problem Relation Age of Onset   Prostate cancer Father    Nephrolithiasis Father     Social History:  reports that he quit smoking about 11 years ago. His smoking use included cigarettes. He has never used smokeless tobacco. He reports current alcohol use. He reports that he does not use drugs.   Physical Exam: BP (!) 168/100   Pulse 93   Ht 5' 9.5" (1.765 m)   Wt 170 lb 6.4 oz (77.3 kg)   BMI 24.80 kg/m   Constitutional:  Well nourished. Alert and oriented, No acute distress. HEENT: [place] AT, moist mucus membranes.  Trachea midline Cardiovascular: No clubbing, cyanosis, or edema. Respiratory: Normal respiratory effort, no increased work of breathing. Neurologic: Grossly intact, no focal deficits, moving all 4 extremities. Psychiatric: Normal mood and affect.   Laboratory Data: Urinalysis  *** I have reviewed the labs.    Pertinent Imaging: *** I have independently reviewed the films.  See HPI.    Assessment & Plan:    1. Right UPJ stone -I expressed my concern that with the large stone ball valving back-and-forth that he may end up having an incident of intense right renal colic while he is away from home working at a job site, an AKI and/or become septic and he agrees and will move forward with definitve treatment for the stone -We discussed various treatment  options for urolithiasis including observation with or without medical expulsive therapy, shockwave lithotripsy (SWL), ureteroscopy and laser lithotripsy with stent placement. -We discussed that management is based on stone size, location, density, patient co-morbidities, and patient preference.  -Stones <75mm in size have a >80% spontaneous passage rate. Data surrounding the use of tamsulosin for medical expulsive therapy is controversial, but meta analyses suggests it is most efficacious for distal stones between 5-2mm in size. Possible side effects include dizziness/lightheadedness -SWL has a lower stone free rate in a single procedure, but also a lower complication rate compared to ureteroscopy and avoids a stent and associated stent related symptoms. Possible complications include renal hematoma, steinstrasse, and need for additional treatment. -Ureteroscopy with laser lithotripsy and stent placement has a higher stone free rate than SWL in a single procedure, however increased complication rate including possible infection, ureteral injury, bleeding, and stent related morbidity. Common stent related symptoms include dysuria, urgency/frequency, and flank pain. -Discussed with  patient that due to the density of the stone but I would recommend ureteroscopy as it is likely the best chance he has a becoming stone free in a single procedure although is always possible that we will need to return at a later date if we are unsuccessful in getting him stone free at the first attempt - explained to the patient how the URS procedure is performed and the risks involved -informed the patient that they will have an ureteral stent, which will remain in place for approximately 3-10 days and can be associated with flank pain, bladder pain, dysuria, urgency, frequency, urinary leakage, and gross hematuria. - stent may be removed in the office with a cystoscope or patient may be instructed to remove the stent themselves  by the string and that is decided on the day of the procedure - residual stones within the kidney or ureter may be present after the procedure and may need to have these addressed at a different encounter - injury to the ureter is the most common intra-operative risk, it may result in an open procedure to correct the defect - infection and bleeding are also risks - explained the risks of general anesthesia, such as: MI, CVA, paralysis, coma and/or death. - advised to contact our office or seek treatment in the ED if becomes febrile or pain/ vomiting are difficult control in order to arrange for emergent/urgent intervention -His questions were answered to his satisfaction, he understands and we are going to proceed with right ureteroscopy laser lithotripsy and ureteral stent placement     2. Bilateral nephrolithiasis -will address the right pelvic stone with URS/LL/stent placement   Return for right URS/LL/ureteral stent placement.  [facility] [address], [place], [place] [address] [phone]

## 2022-06-10 ENCOUNTER — Ambulatory Visit
Admission: RE | Admit: 2022-06-10 | Discharge: 2022-06-10 | Disposition: A | Discharge status: Home / Self Care | Payer: Medicare Other | Source: Ambulatory Visit | Attending: Urology | Admitting: Urology

## 2022-06-10 ENCOUNTER — Ambulatory Visit
Admission: RE | Admit: 2022-06-10 | Discharge: 2022-06-10 | Disposition: A | Discharge status: Home / Self Care | Payer: Medicare Other | Attending: Urology | Admitting: Urology

## 2022-06-10 ENCOUNTER — Encounter: Payer: Self-pay | Admitting: Urology

## 2022-06-10 ENCOUNTER — Telehealth: Payer: Self-pay

## 2022-06-10 ENCOUNTER — Other Ambulatory Visit: Payer: Self-pay | Admitting: Urology

## 2022-06-10 ENCOUNTER — Ambulatory Visit: Payer: Medicare Other | Admitting: Urology

## 2022-06-10 VITALS — BP 168/100 | HR 93 | Ht 69.5 in | Wt 170.4 lb

## 2022-06-10 DIAGNOSIS — N201 Calculus of ureter: Secondary | ICD-10-CM | POA: Insufficient documentation

## 2022-06-10 DIAGNOSIS — N200 Calculus of kidney: Secondary | ICD-10-CM | POA: Diagnosis not present

## 2022-06-10 DIAGNOSIS — N202 Calculus of kidney with calculus of ureter: Secondary | ICD-10-CM

## 2022-06-10 LAB — MICROSCOPIC EXAMINATION

## 2022-06-10 LAB — URINALYSIS, COMPLETE
Bilirubin, UA: NEGATIVE
Glucose, UA: NEGATIVE
Ketones, UA: NEGATIVE
Leukocytes,UA: NEGATIVE
Nitrite, UA: NEGATIVE
Protein,UA: NEGATIVE
Specific Gravity, UA: 1.025 (ref 1.005–1.030)
Urobilinogen, Ur: 0.2 mg/dL (ref 0.2–1.0)
pH, UA: 5.5 (ref 5.0–7.5)

## 2022-06-10 NOTE — Telephone Encounter (Signed)
I spoke with Mr. Danny Castro while in office today. We have discussed possible surgery dates and Friday March 29th, 2024 was agreed upon by all parties. Patient given information about surgery date, what to expect pre-operatively and post operatively.  We discussed that a Pre-Admission Testing office will be calling to set up the pre-op visit that will take place prior to surgery, and that these appointments are typically done over the phone with a Pre-Admissions RN. Informed patient that our office will communicate any additional care to be provided after surgery. Patients questions or concerns were discussed during our call. Advised to call our office should there be any additional information, questions or concerns that arise. Patient verbalized understanding.

## 2022-06-10 NOTE — Progress Notes (Signed)
Surgical Physician Order Form [facility]  * Scheduling expectation :  Dr. Diamantina Providence 06/13/2022  *Length of Case:   *Clearance needed: no  *Anticoagulation Instructions: Hold all anticoagulants  *Aspirin Instructions: Hold Aspirin  *Post-op visit Date/Instructions:   TBD  *Diagnosis: Right UPJ Stone  *Procedure: right Ureteroscopy w/laser lithotripsy & stent placement [id number])   Additional orders: N/A  -Admit type: OUTpatient  -Anesthesia: General  -VTE Prophylaxis Standing Order SCD's       Other:   -Standing Lab Orders Per Anesthesia    Lab other: None  -Standing Test orders EKG/Chest x-ray per Anesthesia       Test other:   - Medications:  Ancef 2gm IV  -Other orders:  N/A

## 2022-06-10 NOTE — Progress Notes (Signed)
   [facility]  Surgery Date: Date: 06/13/2022  Surgeon: Dr. Nickolas Madrid, MD  Inpt ( No  )   Outpt (Yes)   Obs ( No  )   Diagnosis: N20.1 Right Ureteral Stone  -CPT: [id number]  Surgery: Right Ureteroscopy with Laser Lithotripsy and Stent Placement   Stop Anticoagulations: No, may continue all  Cardiac/Medical/Pulmonary Clearance needed: no  *Orders entered into EPIC  Date: 06/10/22   *Case booked in [place]  Date: 06/10/22  *Notified pt of Surgery: Date: 06/10/22  PRE-OP UA & CX: no  *Placed into Prior Authorization Work [place] Date: 06/10/22  Assistant/laser/rep:No

## 2022-06-12 ENCOUNTER — Encounter
Admission: RE | Admit: 2022-06-12 | Discharge: 2022-06-12 | Disposition: A | Discharge status: Home / Self Care | Payer: Medicare Other | Source: Ambulatory Visit | Attending: Urology | Admitting: Urology

## 2022-06-12 ENCOUNTER — Other Ambulatory Visit: Payer: Self-pay

## 2022-06-12 MED ORDER — LACTATED RINGERS IV SOLN: INTRAVENOUS | Status: DC

## 2022-06-12 MED ORDER — CEFAZOLIN SODIUM-DEXTROSE 2-4 GM/100ML-% IV SOLN
2.0000 g | INTRAVENOUS | Status: AC
  Administered 2022-06-13: 2 g via INTRAVENOUS

## 2022-06-12 MED ORDER — CHLORHEXIDINE GLUCONATE 0.12 % MT SOLN: 15.0000 mL | Freq: Once | OROMUCOSAL | Status: AC

## 2022-06-12 MED ORDER — ORAL CARE MOUTH RINSE: 15.0000 mL | Freq: Once | OROMUCOSAL | Status: AC

## 2022-06-12 MED ORDER — FAMOTIDINE 20 MG PO TABS: 20.0000 mg | ORAL_TABLET | Freq: Once | ORAL | Status: AC

## 2022-06-12 NOTE — Patient Instructions (Addendum)
Your procedure is scheduled on: Friday 06/20/22     Report to the Registration Desk on the 1st floor of the [facility] at 11:00. Valet parking is available.  If your arrival time is 6:00 am, do not arrive before that time as the [facility] entrance doors do not open until 6:00 am.  REMEMBER: Instructions that are not followed completely may result in serious medical risk, up to and including death; or upon the discretion of your surgeon and anesthesiologist your surgery may need to be rescheduled.  Do not eat food or drink any liquids after midnight the night before surgery.  No gum chewing or hard candies.  One week prior to surgery: Stop Anti-inflammatories (NSAIDS) such as Advil, Aleve, Ibuprofen, Motrin, Naproxen, Naprosyn and Aspirin based products such as Excedrin, Goody's Powder, BC Powder. You may however, continue to take Tylenol if needed for pain up until the day of surgery.  Stop ANY OVER THE COUNTER supplements until after surgery.  Continue taking all prescribed medications with the exception of the following: Meloxicam hold  until after surgery  [place] WITH A SIP OF WATER:  May take oxycodone if needed  No Alcohol for 24 hours before or after surgery.  No Smoking including e-cigarettes for 24 hours before surgery.  No chewable tobacco products for at least 6 hours before surgery.  No nicotine patches on the day of surgery.  Do not use any "recreational" drugs for at least a week (preferably 2 weeks) before your surgery.  Please be advised that the combination of cocaine and anesthesia may have negative outcomes, up to and including death. If you test positive for cocaine, your surgery will be cancelled.  On the morning of surgery brush your teeth with toothpaste and water, you may rinse your mouth with mouthwash if you wish. Do not swallow any toothpaste or mouthwash.  Use CHG Soap or wipes as directed on instruction  sheet. Shower with your regular soap  Do not wear lotions, powders, or perfumes. You can use deodorant  Do not shave body hair from the neck down 48 hours before surgery.  Wear comfortable clothing (specific to your surgery type) to the hospital.  Do not wear jewelry, make-up, hairpins, clips or nail polish.  Contact lenses, hearing aids and dentures may not be worn into surgery.  Do not bring valuables to the hospital. [facility] is not responsible for any missing/lost belongings or valuables.   Notify your doctor if there is any change in your medical condition (cold, fever, infection).  If you are being discharged the day of surgery, you will not be allowed to drive home. You will need a responsible individual to drive you home and stay with you for 24 hours after surgery.   If you are taking public transportation, you will need to have a responsible individual with you.  If you are being admitted to the hospital overnight, leave your suitcase in the car. After surgery it may be brought to your room.  In case of increased patient census, it may be necessary for you, the patient, to continue your postoperative care in the Same Day Surgery department.  After surgery, you can help prevent lung complications by doing breathing exercises.  Take deep breaths and cough every 1-2 hours. Your doctor may order a device called an Incentive Spirometer to help you take deep breaths. When coughing or sneezing, hold a pillow firmly against your incision with both hands. This  is called "splinting." Doing this helps protect your incision. It also decreases belly discomfort.  Surgery Visitation Policy:  Patients undergoing a surgery or procedure may have two family members or support persons with them as long as the person is not COVID-19 positive or experiencing its symptoms.   Inpatient Visitation:    Visiting hours are 7 a.m. to 8 p.m. Up to four visitors are allowed at one time in a patient  room. The visitors may rotate out with other people during the day. One designated support person (adult) may remain overnight.  Please call the [facility] Dept. at [phone] if you have any questions about these instructions.

## 2022-06-13 ENCOUNTER — Ambulatory Visit: Payer: Medicare Other | Admitting: Anesthesiology

## 2022-06-13 ENCOUNTER — Encounter: Payer: Self-pay | Admitting: Urology

## 2022-06-13 ENCOUNTER — Ambulatory Visit: Payer: Medicare Other

## 2022-06-13 ENCOUNTER — Ambulatory Visit
Admission: RE | Admit: 2022-06-13 | Discharge: 2022-06-13 | Disposition: A | Discharge status: Home / Self Care | Payer: Medicare Other | Attending: Urology | Admitting: Urology

## 2022-06-13 ENCOUNTER — Encounter
Admission: RE | Disposition: A | Discharge status: Home / Self Care | Payer: Self-pay | Source: Home / Self Care | Attending: Urology

## 2022-06-13 DIAGNOSIS — Z841 Family history of disorders of kidney and ureter: Secondary | ICD-10-CM | POA: Diagnosis not present

## 2022-06-13 DIAGNOSIS — N200 Calculus of kidney: Secondary | ICD-10-CM | POA: Diagnosis present

## 2022-06-13 DIAGNOSIS — Z87891 Personal history of nicotine dependence: Secondary | ICD-10-CM | POA: Insufficient documentation

## 2022-06-13 DIAGNOSIS — N201 Calculus of ureter: Secondary | ICD-10-CM

## 2022-06-13 HISTORY — PX: CYSTOSCOPY/URETEROSCOPY/HOLMIUM LASER/STENT PLACEMENT: SHX6546

## 2022-06-13 LAB — CULTURE, URINE COMPREHENSIVE

## 2022-06-13 SURGERY — CYSTOSCOPY/URETEROSCOPY/HOLMIUM LASER/STENT PLACEMENT
Anesthesia: General | Site: Ureter | Laterality: Right

## 2022-06-13 MED ORDER — OXYBUTYNIN CHLORIDE ER 10 MG PO TB24: 10.0000 mg | ORAL_TABLET | Freq: Every day | ORAL | 0 refills | Status: AC

## 2022-06-13 MED ORDER — ACETAMINOPHEN 10 MG/ML IV SOLN: 1000.0000 mg | Freq: Once | INTRAVENOUS | Status: DC | PRN

## 2022-06-13 MED ORDER — MIDAZOLAM HCL 2 MG/2ML IJ SOLN
INTRAMUSCULAR | Status: AC
  Filled 2022-06-13: qty 2

## 2022-06-13 MED ORDER — SODIUM CHLORIDE 0.9 % IR SOLN
Status: DC | PRN
  Administered 2022-06-13: 3000 mL via INTRAVESICAL

## 2022-06-13 MED ORDER — MIDAZOLAM HCL 2 MG/2ML IJ SOLN
INTRAMUSCULAR | Status: DC | PRN
  Administered 2022-06-13: 2 mg via INTRAVENOUS

## 2022-06-13 MED ORDER — OXYCODONE-ACETAMINOPHEN 5-325 MG PO TABS: 1.0000 | ORAL_TABLET | Freq: Four times a day (QID) | ORAL | 0 refills | Status: AC | PRN

## 2022-06-13 MED ORDER — PROPOFOL 10 MG/ML IV BOLUS
INTRAVENOUS | Status: DC | PRN
  Administered 2022-06-13: 140 mg via INTRAVENOUS

## 2022-06-13 MED ORDER — DEXMEDETOMIDINE HCL IN NACL 80 MCG/20ML IV SOLN
INTRAVENOUS | Status: DC | PRN
  Administered 2022-06-13: 8 ug via BUCCAL

## 2022-06-13 MED ORDER — IOHEXOL 180 MG/ML  SOLN
INTRAMUSCULAR | Status: DC | PRN
  Administered 2022-06-13: 10 mL

## 2022-06-13 MED ORDER — FAMOTIDINE 20 MG PO TABS
ORAL_TABLET | ORAL | Status: AC
  Administered 2022-06-13: 20 mg via ORAL
  Filled 2022-06-13: qty 1

## 2022-06-13 MED ORDER — ONDANSETRON HCL 4 MG/2ML IJ SOLN
INTRAMUSCULAR | Status: DC | PRN
  Administered 2022-06-13: 4 mg via INTRAVENOUS

## 2022-06-13 MED ORDER — CHLORHEXIDINE GLUCONATE 0.12 % MT SOLN
OROMUCOSAL | Status: AC
  Administered 2022-06-13: 15 mL via OROMUCOSAL
  Filled 2022-06-13: qty 15

## 2022-06-13 MED ORDER — ONDANSETRON HCL 4 MG/2ML IJ SOLN: 4.0000 mg | Freq: Once | INTRAMUSCULAR | Status: DC | PRN

## 2022-06-13 MED ORDER — SUGAMMADEX SODIUM 200 MG/2ML IV SOLN
INTRAVENOUS | Status: DC | PRN
  Administered 2022-06-13: 200 mg via INTRAVENOUS

## 2022-06-13 MED ORDER — CEFAZOLIN SODIUM-DEXTROSE 2-4 GM/100ML-% IV SOLN
INTRAVENOUS | Status: AC
  Filled 2022-06-13: qty 100

## 2022-06-13 MED ORDER — KETOROLAC TROMETHAMINE 15 MG/ML IJ SOLN
INTRAMUSCULAR | Status: DC | PRN
  Administered 2022-06-13: 30 mg via INTRAVENOUS

## 2022-06-13 MED ORDER — LIDOCAINE HCL (CARDIAC) PF 100 MG/5ML IV SOSY
PREFILLED_SYRINGE | INTRAVENOUS | Status: DC | PRN
  Administered 2022-06-13: 100 mg via INTRAVENOUS

## 2022-06-13 MED ORDER — FENTANYL CITRATE (PF) 100 MCG/2ML IJ SOLN
INTRAMUSCULAR | Status: DC | PRN
  Administered 2022-06-13: 100 ug via INTRAVENOUS

## 2022-06-13 MED ORDER — TAMSULOSIN HCL 0.4 MG PO CAPS: 0.4000 mg | ORAL_CAPSULE | Freq: Every day | ORAL | 0 refills | Status: AC

## 2022-06-13 MED ORDER — PROPOFOL 1000 MG/100ML IV EMUL
INTRAVENOUS | Status: AC
  Filled 2022-06-13: qty 100

## 2022-06-13 MED ORDER — OXYCODONE HCL 5 MG/5ML PO SOLN: 5.0000 mg | Freq: Once | ORAL | Status: DC | PRN

## 2022-06-13 MED ORDER — OXYCODONE HCL 5 MG PO TABS: 5.0000 mg | ORAL_TABLET | Freq: Once | ORAL | Status: DC | PRN

## 2022-06-13 MED ORDER — ROCURONIUM BROMIDE 100 MG/10ML IV SOLN
INTRAVENOUS | Status: DC | PRN
  Administered 2022-06-13: 10 mg via INTRAVENOUS
  Administered 2022-06-13: 50 mg via INTRAVENOUS

## 2022-06-13 MED ORDER — LACTATED RINGERS IV SOLN: INTRAVENOUS | Status: DC

## 2022-06-13 MED ORDER — PROPOFOL 10 MG/ML IV BOLUS
INTRAVENOUS | Status: AC
  Filled 2022-06-13: qty 20

## 2022-06-13 MED ORDER — FENTANYL CITRATE (PF) 100 MCG/2ML IJ SOLN: 25.0000 ug | INTRAMUSCULAR | Status: DC | PRN

## 2022-06-13 MED ORDER — FENTANYL CITRATE (PF) 100 MCG/2ML IJ SOLN
INTRAMUSCULAR | Status: AC
  Filled 2022-06-13: qty 2

## 2022-06-13 SURGICAL SUPPLY — 32 items
ADH LQ OCL WTPRF AMP STRL LF (MISCELLANEOUS)
ADHESIVE MASTISOL STRL (MISCELLANEOUS) IMPLANT
BAG DRAIN SIEMENS DORNER NS (MISCELLANEOUS) ×1 IMPLANT
BAG DRN NS LF (MISCELLANEOUS) ×1
BAG PRESSURE INF REUSE 3000 (BAG) ×1 IMPLANT
BRUSH SCRUB EZ 1% IODOPHOR (MISCELLANEOUS) ×1 IMPLANT
CATH URET FLEX-TIP 2 LUMEN 10F (CATHETERS) IMPLANT
CATH URETL OPEN 5X70 (CATHETERS) IMPLANT
CNTNR URN SCR LID CUP LEK RST (MISCELLANEOUS) IMPLANT
CONT SPEC 4OZ STRL OR WHT (MISCELLANEOUS)
DRAPE UTILITY 15X26 TOWEL STRL (DRAPES) ×1 IMPLANT
DRSG TEGADERM 2-3/8X2-3/4 SM (GAUZE/BANDAGES/DRESSINGS) IMPLANT
FIBER LASER MOSES 200 DFL (Laser) IMPLANT
FIBER LASER MOSES 365 DFL (Laser) IMPLANT
GLOVE BIOGEL PI IND STRL 7.5 (GLOVE) ×1 IMPLANT
GOWN STRL REUS W/ TWL LRG LVL3 (GOWN DISPOSABLE) ×1 IMPLANT
GOWN STRL REUS W/ TWL XL LVL3 (GOWN DISPOSABLE) ×1 IMPLANT
GOWN STRL REUS W/TWL LRG LVL3 (GOWN DISPOSABLE) ×1
GOWN STRL REUS W/TWL XL LVL3 (GOWN DISPOSABLE) ×1
GUIDEWIRE STR DUAL SENSOR (WIRE) ×1 IMPLANT
IV NS IRRIG 3000ML ARTHROMATIC (IV SOLUTION) ×1 IMPLANT
KIT TURNOVER CYSTO (KITS) ×1 IMPLANT
PACK CYSTO AR (MISCELLANEOUS) ×1 IMPLANT
SET CYSTO W/LG BORE CLAMP LF (SET/KITS/TRAYS/PACK) ×1 IMPLANT
SHEATH NAVIGATOR HD 12/14X36 (SHEATH) IMPLANT
SHEATH NAVIGATOR HD 12/14X46 (SHEATH) IMPLANT
STENT URET 6FRX24 CONTOUR (STENTS) IMPLANT
STENT URET 6FRX26 CONTOUR (STENTS) IMPLANT
SURGILUBE 2OZ TUBE FLIPTOP (MISCELLANEOUS) ×1 IMPLANT
SYR 10ML LL (SYRINGE) ×1 IMPLANT
VALVE UROSEAL ADJ ENDO (VALVE) IMPLANT
WATER STERILE IRR 500ML POUR (IV SOLUTION) ×1 IMPLANT

## 2022-06-13 NOTE — Transfer of Care (Signed)
Immediate Anesthesia Transfer of Care Note  Patient: Danny Castro  Procedure(s) Performed: CYSTOSCOPY/URETEROSCOPY/HOLMIUM LASER/STENT PLACEMENT (Right: Ureter)  Patient Location: PACU  Anesthesia Type:General  Level of Consciousness: awake, alert , and oriented  Airway & Oxygen Therapy: Patient Spontanous Breathing  Post-op Assessment: Report given to RN and Post -op Vital signs reviewed and stable  Post vital signs: stable  Last Vitals:  Vitals Value Taken Time  BP 165/98 06/13/22 1518  Temp 36.8 C 06/13/22 1518  Pulse 71 06/13/22 1520  Resp 14 06/13/22 1520  SpO2 98 % 06/13/22 1520  Vitals shown include unvalidated device data.  Last Pain:  Vitals:   06/13/22 1518  TempSrc:   PainSc: Asleep         Complications: No notable events documented.

## 2022-06-13 NOTE — Anesthesia Procedure Notes (Signed)
Procedure Name: Intubation Date/Time: 06/13/2022 2:31 PM  Performed by: Patience Musca., CRNAPre-anesthesia Checklist: Patient identified, Patient being monitored, Timeout performed, Emergency Drugs available and Suction available Patient Re-evaluated:Patient Re-evaluated prior to induction Oxygen Delivery Method: Circle system utilized Preoxygenation: Pre-oxygenation with 100% oxygen Induction Type: IV induction Ventilation: Mask ventilation without difficulty Laryngoscope Size: McGraph and 4 Grade View: Grade I Tube type: Oral Tube size: 7.5 mm Number of attempts: 1 Airway Equipment and Method: Stylet Placement Confirmation: ETT inserted through vocal cords under direct vision, positive ETCO2 and breath sounds checked- equal and bilateral Secured at: 21 cm Tube secured with: Tape Dental Injury: Teeth and Oropharynx as per pre-operative assessment

## 2022-06-13 NOTE — Discharge Instructions (Signed)

## 2022-06-13 NOTE — Op Note (Signed)
Date of procedure: 06/13/22  Preoperative diagnosis:  Right renal stone  Postoperative diagnosis:  Same   Procedure: Cystoscopy, right ureteroscopy, laser lithotripsy, right retrograde pyelogram with intraoperative interpretation, right ureteral stent placement  Surgeon: Nickolas Madrid, MD  Anesthesia: General  Complications: None  Intraoperative findings:  Small prostate, moderate bladder trabeculations, no suspicious lesions Uncomplicated dusting of large right renal stone and all other smaller renal stones with stent placement  EBL: Minimal  Specimens: None  Drains: Right 6 French by 26 cm ureteral stent  Indication: Danny Castro is a 72 y.o. patient with large 1.5 cm right renal stone with intermittent flank pain who opted for ureteroscopy.  After reviewing the management options for treatment, they elected to proceed with the above surgical procedure(s). We have discussed the potential benefits and risks of the procedure, side effects of the proposed treatment, the likelihood of the patient achieving the goals of the procedure, and any potential problems that might occur during the procedure or recuperation. Informed consent has been obtained.  Description of procedure:  The patient was taken to the operating room and general anesthesia was induced. SCDs were placed for DVT prophylaxis. The patient was placed in the dorsal lithotomy position, prepped and draped in the usual sterile fashion, and preoperative antibiotics(Ancef) were administered. A preoperative time-out was performed.   A 21 French rigid cystoscope was used to intubate the urethra and a normal-appearing urethra was followed proximally into the bladder.  The prostate was small.  There were mild bladder trabeculations and no suspicious lesions, and the ureteral orifices were orthotopic bilaterally.  A sensor wire advanced easily into the right kidney under fluoroscopic vision.  A dual-lumen ureteral access catheter  was advanced over the wire, and a second wire added.  A 12/14 French ureteral access sheath was gently advanced over the wire under fluoroscopic vision up to the UPJ.  The digital single-channel flexible ureteroscope was advanced through the sheath and thorough pyeloscopy showed multiple small stones in the calyces, and a large 1.5 cm renal stone.  The 360 m laser fiber on settings of 0.5 J and 80 Hz was used to methodically dust all stones.  Thorough pyeloscopy showed no other stone fragments larger than the laser fiber.  The sheath was removed and careful pullback ureteroscopy showed no residual stones in the ureter or ureteral abnormalities.  The rigid cystoscope was backloaded over the wire and a 6 [place] by 26 cm ureteral stent uneventfully placed with a curl in the renal pelvis, as well as in the bladder.  The bladder was drained and this concluded our procedure.  Disposition: Stable to PACU  Plan: Follow-up for stent removal in clinic in 1 to 2 weeks  Nickolas Madrid, MD

## 2022-06-13 NOTE — Anesthesia Preprocedure Evaluation (Addendum)
Anesthesia Evaluation  Patient identified by MRN, date of birth, ID band Patient awake    Reviewed: Allergy & Precautions, NPO status , Patient's Chart, lab work & pertinent test results  History of Anesthesia Complications Negative for: history of anesthetic complications  Airway Mallampati: IV   Neck ROM: Full    Dental no notable dental hx.    Pulmonary former smoker (quit 2012)   Pulmonary exam normal breath sounds clear to auscultation       Cardiovascular Exercise Tolerance: Good negative cardio ROS Normal cardiovascular exam Rhythm:Regular Rate:Normal  ECG 12/04/21: NSR; nonspecific ST and T changes; no STEMI   Neuro/Psych negative neurological ROS     GI/Hepatic negative GI ROS,,,  Endo/Other  negative endocrine ROS    Renal/GU Renal disease (nephrolithiasis)     Musculoskeletal  (+) Arthritis ,    Abdominal   Peds  Hematology negative hematology ROS (+)   Anesthesia Other Findings   Reproductive/Obstetrics                             Anesthesia Physical Anesthesia Plan  ASA: 2  Anesthesia Plan: General   Post-op Pain Management:    Induction: Intravenous  PONV Risk Score and Plan: 2 and Ondansetron, Dexamethasone and Treatment may vary due to age or medical condition  Airway Management Planned: Oral ETT  Additional Equipment:   Intra-op Plan:   Post-operative Plan: Extubation in OR  Informed Consent: I have reviewed the patients History and Physical, chart, labs and discussed the procedure including the risks, benefits and alternatives for the proposed anesthesia with the patient or authorized representative who has indicated his/her understanding and acceptance.     Dental advisory given  Plan Discussed with: CRNA  Anesthesia Plan Comments: (Patient consented for risks of anesthesia including but not limited to:  - adverse reactions to medications - damage  to eyes, teeth, lips or other oral mucosa - nerve damage due to positioning  - sore throat or hoarseness - damage to heart, brain, nerves, lungs, other parts of body or loss of life  Informed patient about role of CRNA in peri- and intra-operative care.  Patient voiced understanding.)        Anesthesia Quick Evaluation

## 2022-06-13 NOTE — Interval H&P Note (Signed)
UROLOGY H&P UPDATE  Agree with prior H&P dated 06/10/2022 by Danny Council, PA.  Large right renal stone, history of intermittent right renal colic, patient opted for definitive treatment with ureteroscopy.  Cardiac: RRR Lungs: CTA bilaterally  Laterality: Right Procedure: Right ureteroscopy, laser lithotripsy, stent placement   Urine: Contaminated with Streptococcus mitis 1000 colonies  We specifically discussed the risks ureteroscopy including bleeding, infection/sepsis, stent related symptoms including flank pain/urgency/frequency/incontinence/dysuria, ureteral injury, inability to access stone, or need for staged or additional procedures.   Billey Co, MD 06/13/2022

## 2022-06-15 ENCOUNTER — Encounter: Payer: Self-pay | Admitting: Urology

## 2022-06-16 NOTE — Anesthesia Postprocedure Evaluation (Signed)
Anesthesia Post Note  Patient: Danny Castro  Procedure(s) Performed: CYSTOSCOPY/URETEROSCOPY/HOLMIUM LASER/STENT PLACEMENT (Right: Ureter)  Patient location during evaluation: PACU Anesthesia Type: General Level of consciousness: awake and alert, oriented and patient cooperative Pain management: pain level controlled Vital Signs Assessment: post-procedure vital signs reviewed and stable Respiratory status: spontaneous breathing, nonlabored ventilation and respiratory function stable Cardiovascular status: blood pressure returned to baseline and stable Postop Assessment: adequate PO intake Anesthetic complications: no   There were no known notable events for this encounter.   Last Vitals:  Vitals:   06/13/22 1542 06/13/22 1554  BP: (!) 153/86 (!) 165/94  Pulse: 61 63  Resp: 14 16  Temp: (!) 36.3 C (!) 36.1 C  SpO2: 100% 100%    Last Pain:  Vitals:   06/13/22 1554  TempSrc: Temporal  PainSc: 2                  Darrin Nipper

## 2022-06-19 ENCOUNTER — Telehealth: Payer: Self-pay | Admitting: Urology

## 2022-06-19 NOTE — Telephone Encounter (Signed)
Called pt he states that yesterday he had 2 episodes of pain that felt very similar to when he had kidney stones. Pain has since resolved. Patient denies fever, chills, or dysuria. He has not taken any pain medication for relief. Patient states that he would prefer not to take oxycodone that was prescribed. Of note patient was doing heavy lifting yesterday when pain began. Advised pt on stent related pain, the use of Aleve for pain relief, staying well hydrated, and continuing oxybutynin and Flomax. Patient voiced understanding. RTC precautions discussed. Patient education sent via mychart.   Possible Side Effects of Stents Blood in the urine (Hematuria). This can be tea-colored, pink or bright red; you may even notice some clots. The blood may come and go while you have the stent; this is normal.  If you have bright red blood that is very thick and lasts most of the day, you may need to contact the office and discuss this with your provider.  Drinking water will help clear the blood in your urine. Pain: There can be flank, side or back pain due to the stent. It may be worse with movement. There are medicines?that can help you with pain management. Urinary urgency and frequency: You may notice you have to urinate very quickly and very often. There are medications to alleviate these symptoms; please discuss this with your provider. Burning with urination: You may experience this with a stent. Medications can help with this symptom. CALL YOUR PHYSICIAN'S OFFICE IF YOU HAVE:  Fever of 101.5 or greater Constant urine leakage after the stent has been placed  A Medical Assistant or Triage nurse is available 24 hours a day for any medical questions or needs you may have.  The triage staff can help answer questions you may have at any time of day or night. They can be reached at the Clinic's main number of [phone].

## 2022-06-19 NOTE — Telephone Encounter (Signed)
Patient called and stated that he has been having stomach pain on and off since his procedure on 3/29. He said that after urinating the pain went away for awhile, and has not been as bad. He is wondering if he could have been passing stones, or if pain after the procedure is normal.

## 2022-06-25 ENCOUNTER — Encounter: Payer: Self-pay | Admitting: Urology

## 2022-06-25 ENCOUNTER — Ambulatory Visit (INDEPENDENT_AMBULATORY_CARE_PROVIDER_SITE_OTHER): Payer: Medicare Other | Admitting: Urology

## 2022-06-25 VITALS — BP 132/84 | HR 80 | Ht 69.5 in | Wt 170.0 lb

## 2022-06-25 DIAGNOSIS — N200 Calculus of kidney: Secondary | ICD-10-CM

## 2022-06-25 DIAGNOSIS — Z466 Encounter for fitting and adjustment of urinary device: Secondary | ICD-10-CM | POA: Diagnosis not present

## 2022-06-25 LAB — URINALYSIS, COMPLETE
Bilirubin, UA: NEGATIVE
Glucose, UA: NEGATIVE
Ketones, UA: NEGATIVE
Nitrite, UA: NEGATIVE
Specific Gravity, UA: 1.025 (ref 1.005–1.030)
Urobilinogen, Ur: 0.2 mg/dL (ref 0.2–1.0)
pH, UA: 6 (ref 5.0–7.5)

## 2022-06-25 LAB — MICROSCOPIC EXAMINATION
RBC, Urine: >30 /hpf — AB (ref 0–2)
WBC, UA: >30 /hpf — AB (ref 0–5)

## 2022-06-25 MED ORDER — LIDOCAINE HCL URETHRAL/MUCOSAL 2 % EX GEL
1.0000 | Freq: Once | CUTANEOUS | Status: AC
  Administered 2022-06-25: 1 via URETHRAL

## 2022-06-25 MED ORDER — CEPHALEXIN 250 MG PO CAPS
500.0000 mg | ORAL_CAPSULE | Freq: Once | ORAL | Status: AC
  Administered 2022-06-25: 500 mg via ORAL

## 2022-06-25 NOTE — Progress Notes (Signed)
Cystoscopy Procedure Note:  Indication: Stent removal s/p 06/13/2022 right ureteroscopy/laser lithotripsy for 1.5 cm renal pelvis stone  Keflex given for prophylaxis   After informed consent and discussion of the procedure and its risks, Danny Castro was positioned and prepped in the standard fashion. Cystoscopy was performed with a flexible cystoscope. The stent was grasped with flexible graspers and removed in its entirety. The patient tolerated the procedure well.  Findings: Uncomplicated stent removal  Assessment and Plan: We discussed general stone prevention strategies including adequate hydration with goal of producing 2.5 L of urine daily, increasing citric acid intake, increasing calcium intake during high oxalate meals, minimizing animal protein, and decreasing salt intake. Information about dietary recommendations given today.   RTC 6 months KUB prior   Sondra Come, MD 06/25/2022

## 2022-06-26 ENCOUNTER — Telehealth: Payer: Self-pay | Admitting: *Deleted

## 2022-06-26 NOTE — Telephone Encounter (Signed)
Calling pt to follow-up on after hours call. Pt was having groin pain after stent removal. Pt has urinated and there was sand in the toilet, no fever.  Spoke with patient and he is feeling fine.

## 2022-07-20 ENCOUNTER — Other Ambulatory Visit: Payer: Self-pay | Admitting: Urology

## 2022-07-31 ENCOUNTER — Ambulatory Visit: Payer: Medicare Other | Admitting: Podiatry

## 2022-07-31 DIAGNOSIS — M7751 Other enthesopathy of right foot: Secondary | ICD-10-CM | POA: Diagnosis not present

## 2022-07-31 NOTE — Progress Notes (Signed)
  Subjective:  Patient ID: Danny Castro, male    DOB: 1950/07/04,  MRN: [id number]  Chief Complaint  Patient presents with   Toe Pain    Right foot 3rd toe pain off and on for a couple of months    72 y.o. male presents with the above complaint. Patient presents with complaint of right third PIPJ pain.  Patient states been going for couple months he did have injury he works in [employer] he denies any other acute complaints he has not gotten a steroid injection in the past.  He had x-rays done at another facility which did not show any fracture.   Review of Systems: Negative except as noted in the HPI. Denies N/V/F/Ch.  Past Medical History:  Diagnosis Date   Calculus of kidney 08/19/2013   History of kidney stones    Nephrolithiasis    OA (osteoarthritis)    Primary osteoarthritis of both knees 04/10/2014    Current Outpatient Medications:    meloxicam (MOBIC) 15 MG tablet, Take 15 mg by mouth daily. (Patient not taking: Reported on 06/13/2022), Disp: , Rfl:    tamsulosin (FLOMAX) 0.4 MG CAPS capsule, Take 1 capsule (0.4 mg total) by mouth daily after supper., Disp: 14 capsule, Rfl: 0  Social History   Tobacco Use  Smoking Status Former   Types: Cigarettes   Quit date: 11/29/2010   Years since quitting: 11.6  Smokeless Tobacco Never    Allergies  Allergen Reactions   Prednisone Anxiety   Objective:  There were no vitals filed for this visit. There is no height or weight on file to calculate BMI. Constitutional Well developed. Well nourished.  Vascular Dorsalis pedis pulses palpable bilaterally. Posterior tibial pulses palpable bilaterally. Capillary refill normal to all digits.  No cyanosis or clubbing noted. Pedal hair growth normal.  Neurologic Normal speech. Oriented to person, place, and time. Epicritic sensation to light touch grossly present bilaterally.  Dermatologic Nails well groomed and normal in appearance. No open wounds. No skin lesions.  Orthopedic:  Patient presents with right third PIPJ joint pain.  Patient states is painful with range of motion of the joint no pain at the metatarsophalangeal joint of the third.  Negative extensor or flexor tendinitis noted.   Radiographs: None Assessment:   1. Capsulitis of toe, right    Plan:  Patient was evaluated and treated and all questions answered.  Right third PIPJ joint capsulitis -All questions and concerns were discussed with the patient in extensive detail given the amount of pain that she is having he will benefit from a steroid injection -A steroid injection was performed at Right third PIPJ using 1% plain Lidocaine and 10 mg of Kenalog. This was well tolerated. -I discussed shoe gear modification with the patient in extensive detail   No follow-ups on file.

## 2022-08-07 ENCOUNTER — Telehealth: Payer: Self-pay | Admitting: Podiatry

## 2022-08-07 NOTE — Telephone Encounter (Signed)
Patients wife called stating that her husband had an injection in the toe and it is not helping and is asking if there is anything he can do or take in the meantime. Please contact patient.

## 2022-08-08 ENCOUNTER — Ambulatory Visit: Payer: Medicare Other | Admitting: Podiatry

## 2022-08-12 ENCOUNTER — Ambulatory Visit: Payer: Medicare Other | Admitting: Podiatry

## 2022-08-12 DIAGNOSIS — M10071 Idiopathic gout, right ankle and foot: Secondary | ICD-10-CM

## 2022-08-12 DIAGNOSIS — M7751 Other enthesopathy of right foot: Secondary | ICD-10-CM

## 2022-08-12 DIAGNOSIS — M659 Synovitis and tenosynovitis, unspecified: Secondary | ICD-10-CM

## 2022-08-12 MED ORDER — MELOXICAM 15 MG PO TABS: 15.0000 mg | ORAL_TABLET | Freq: Every day | ORAL | 0 refills | Status: AC

## 2022-08-12 NOTE — Addendum Note (Signed)
Addended by: Nicholes Rough on: 08/12/2022 08:00 AM   Modules accepted: Orders

## 2022-08-12 NOTE — Progress Notes (Signed)
  Subjective:  Patient ID: Danny Castro, male    DOB: 12-28-1950,  MRN: [id number]  Chief Complaint  Patient presents with   Toe Pain    72 y.o. male presents with the above complaint. Patient presents with complaint of right third PIPJ pain.  Patient states injection helped some.  But it flared back up again.  He may have a history of gout.   Review of Systems: Negative except as noted in the HPI. Denies N/V/F/Ch.  Past Medical History:  Diagnosis Date   Calculus of kidney 08/19/2013   History of kidney stones    Nephrolithiasis    OA (osteoarthritis)    Primary osteoarthritis of both knees 04/10/2014    Current Outpatient Medications:    meloxicam (MOBIC) 15 MG tablet, Take 15 mg by mouth daily. (Patient not taking: Reported on 06/13/2022), Disp: , Rfl:    meloxicam (MOBIC) 15 MG tablet, Take 1 tablet (15 mg total) by mouth daily., Disp: 30 tablet, Rfl: 0   tamsulosin (FLOMAX) 0.4 MG CAPS capsule, Take 1 capsule (0.4 mg total) by mouth daily after supper., Disp: 14 capsule, Rfl: 0  Social History   Tobacco Use  Smoking Status Former   Types: Cigarettes   Quit date: 11/29/2010   Years since quitting: 11.7  Smokeless Tobacco Never    Allergies  Allergen Reactions   Prednisone Anxiety   Objective:  There were no vitals filed for this visit. There is no height or weight on file to calculate BMI. Constitutional Well developed. Well nourished.  Vascular Dorsalis pedis pulses palpable bilaterally. Posterior tibial pulses palpable bilaterally. Capillary refill normal to all digits.  No cyanosis or clubbing noted. Pedal hair growth normal.  Neurologic Normal speech. Oriented to person, place, and time. Epicritic sensation to light touch grossly present bilaterally.  Dermatologic Nails well groomed and normal in appearance. No open wounds. No skin lesions.  Orthopedic: Patient presents with right third PIPJ joint pain.  Patient states is painful with range of motion of the  joint no pain at the metatarsophalangeal joint of the third.  Negative extensor or flexor tendinitis noted.   Radiographs: None Assessment:   1. Synovitis of toe   2. Acute idiopathic gout involving toe of right foot     Plan:  Patient was evaluated and treated and all questions answered.  Right third PIPJ joint capsulitis -All questions and concerns were discussed with the patient in extensive detail given the amount of pain that she is having he will benefit from a steroid injection -A steroid injection was performed at Right third PIPJ using 1% plain Lidocaine and 10 mg of Kenalog. This was well tolerated. -I discussed shoe gear modification with the patient in extensive detail -Discussed gout management as patient may be experiencing inflamed joints secondary to gout.  Discussed diet controlled in extensive detail   No follow-ups on file.

## 2022-12-24 NOTE — Progress Notes (Unsigned)
12/25/22 2:46 PM   Danny Castro Aug 08, 1950 [id number]  Referring provider:  Danella Penton, MD [phone] [facility] [address] [facility] [place],  [place] [address]  Urological history:  1. High risk hematuria  -former smoker  -CTU 11/2014 - bilateral nephrolithiasis  -cysto 2016- refused  -cysto 2018 with stent removal - NED  -cysto 2024 - NED  2. Nephrolithiasis  -Composition - 90% CaOx / 10% CaPO4  -pre 2017 MET x 3, SWL x 1  -right URS 2017  -SWL 2020   -CT renal stone (03/2022) - Bilateral nephrolithiasis. No evidence of ureteral calculi, hydronephrosis, or other acute findings. -right URS (05/2022)   3. BPH with LU TS - PSA (02/2022) 0.76  Chief Complaint  Patient presents with   Nephrolithiasis    HPI: Danny Castro is a 72 y.o.male who presents today for 6 month follow-up with KUB.  Previous records reviewed.   He underwent a right ureteroscopy in March 2024 with subsequent stent removal in the office.  He has not had any issues with stone since that time.    Patient denies any modifying or aggravating factors.  Patient denies any recent UTI's, gross hematuria, dysuria or suprapubic/flank pain.  Patient denies any fevers, chills, nausea or vomiting.    UA w/ micro heme  KUB bilateral nephrolithiasis with 7 mm stone being the largest in the left lower pole and a 9 mm mean the largest on the right mid pole  PMH: Past Medical History:  Diagnosis Date   Calculus of kidney 08/19/2013   History of kidney stones    Nephrolithiasis    OA (osteoarthritis)    Primary osteoarthritis of both knees 04/10/2014    Surgical History: Past Surgical History:  Procedure Laterality Date   COLONOSCOPY     CYSTOSCOPY W/ URETERAL STENT PLACEMENT Right 04/04/2016   Procedure: CYSTOSCOPY WITH STENT REPLACEMENT;  Surgeon: Hildred Laser, MD;  Location: [facility];  Service: Urology;  Laterality: Right;   CYSTOSCOPY WITH STENT PLACEMENT Right 03/13/2016   Procedure:  CYSTOSCOPY WITH STENT PLACEMENT;  Surgeon: Hildred Laser, MD;  Location: [facility];  Service: Urology;  Laterality: Right;   CYSTOSCOPY/URETEROSCOPY/HOLMIUM LASER/STENT PLACEMENT Right 06/13/2022   Procedure: CYSTOSCOPY/URETEROSCOPY/HOLMIUM LASER/STENT PLACEMENT;  Surgeon: Sondra Come, MD;  Location: [facility];  Service: Urology;  Laterality: Right;   EXTRACORPOREAL SHOCK WAVE LITHOTRIPSY Right 01/04/2015   Procedure: EXTRACORPOREAL SHOCK WAVE LITHOTRIPSY (ESWL);  Surgeon: Lorraine Lax, MD;  Location: [facility];  Service: Urology;  Laterality: Right;   EXTRACORPOREAL SHOCK WAVE LITHOTRIPSY N/A 07/08/2018   Procedure: EXTRACORPOREAL SHOCK WAVE LITHOTRIPSY (ESWL);  Surgeon: [name], MD;  Location: [facility];  Service: Urology;  Laterality: N/A;   EXTRACORPOREAL SHOCK WAVE LITHOTRIPSY Right 02/21/2021   Procedure: EXTRACORPOREAL SHOCK WAVE LITHOTRIPSY (ESWL);  Surgeon: Sondra Come, MD;  Location: [facility];  Service: Urology;  Laterality: Right;   TONSILLECTOMY     URETEROSCOPY Right 03/13/2016   Procedure: URETEROSCOPY;  Surgeon: Hildred Laser, MD;  Location: [facility];  Service: Urology;  Laterality: Right;   URETEROSCOPY WITH HOLMIUM LASER LITHOTRIPSY Right 04/04/2016   Procedure: URETEROSCOPY WITH HOLMIUM LASER LITHOTRIPSY;  Surgeon: Hildred Laser, MD;  Location: [facility];  Service: Urology;  Laterality: Right;    Home Medications:  Allergies as of 12/25/2022       Reactions   Prednisone Anxiety        Medication List        Accurate as of December 25, 2022  2:46 PM. If you have any questions, ask your nurse or doctor.          azelastine 0.1 % nasal spray Commonly known as: ASTELIN Place 1 spray into both nostrils 2 (two) times daily.   meloxicam 15 MG tablet Commonly known as: MOBIC Take 1 tablet (15 mg total) by mouth daily. What changed: Another medication with the same name was removed. Continue taking this medication, and follow the directions  you see here. Changed by: Devyn Griffing   olmesartan 20 MG tablet Commonly known as: BENICAR Take 20 mg by mouth daily.   tamsulosin 0.4 MG Caps capsule Commonly known as: FLOMAX Take 1 capsule (0.4 mg total) by mouth daily after supper.        Allergies:  Allergies  Allergen Reactions   Prednisone Anxiety    Family History: Family History  Problem Relation Age of Onset   Prostate cancer Father    Nephrolithiasis Father     Social History:  reports that he quit smoking about 12 years ago. His smoking use included cigarettes. He has never used smokeless tobacco. He reports current alcohol use. He reports that he does not use drugs.   Physical Exam: BP (!) 160/89   Pulse 87   Ht 5' 9.5" (1.765 m)   Wt 170 lb (77.1 kg)   BMI 24.74 kg/m   Constitutional:  Well nourished. Alert and oriented, No acute distress. HEENT: [place] AT, moist mucus membranes.  Trachea midline Cardiovascular: No clubbing, cyanosis, or edema. Respiratory: Normal respiratory effort, no increased work of breathing. Neurologic: Grossly intact, no focal deficits, moving all 4 extremities. Psychiatric: Normal mood and affect.   Laboratory Data: Urinalysis  See HPI and EPIC I have reviewed the labs.    Pertinent Imaging: KUB bilateral nephrolithiasis, radiologist interpretation pending I have independently reviewed the films.  See HPI.     Assessment & Plan:    1. Bilateral nephrolithiasis -KUB demonstrates bilateral nephrolithiasis -He has had no passage of fragments or flank pain -continue to monitor yearly with KUB's -returned to clinic precautions reviewed  2.  Microscopic hematuria -UA with micro heme -Likely due to nephrolithiasis -He has had a recent cystoscopy and upper tract imaging which was negative for any worrisome findings -Will continue to monitor -He will report any gross hematuria in the interim  Return in about 1 year (around 12/25/2023) for KUB, UA .  [facility] [address], [address] [place], [place] [address] [phone]

## 2022-12-25 ENCOUNTER — Ambulatory Visit: Payer: Medicare Other | Admitting: Urology

## 2022-12-25 ENCOUNTER — Ambulatory Visit
Admission: RE | Admit: 2022-12-25 | Discharge: 2022-12-25 | Disposition: A | Discharge status: Home / Self Care | Payer: Medicare Other | Source: Ambulatory Visit | Attending: Urology | Admitting: Urology

## 2022-12-25 ENCOUNTER — Encounter: Payer: Self-pay | Admitting: Urology

## 2022-12-25 VITALS — BP 160/89 | HR 87 | Ht 69.5 in | Wt 170.0 lb

## 2022-12-25 DIAGNOSIS — N200 Calculus of kidney: Secondary | ICD-10-CM

## 2022-12-25 DIAGNOSIS — R3129 Other microscopic hematuria: Secondary | ICD-10-CM | POA: Diagnosis not present

## 2022-12-25 LAB — URINALYSIS, COMPLETE
Bilirubin, UA: NEGATIVE
Glucose, UA: NEGATIVE
Ketones, UA: NEGATIVE
Leukocytes,UA: NEGATIVE
Nitrite, UA: NEGATIVE
Protein,UA: NEGATIVE
Specific Gravity, UA: >1.03 — ABNORMAL HIGH (ref 1.005–1.030)
Urobilinogen, Ur: 0.2 mg/dL (ref 0.2–1.0)
pH, UA: 5.5 (ref 5.0–7.5)

## 2022-12-25 LAB — MICROSCOPIC EXAMINATION

## 2023-09-04 ENCOUNTER — Encounter: Payer: Self-pay | Admitting: Gastroenterology

## 2023-09-25 ENCOUNTER — Other Ambulatory Visit (INDEPENDENT_AMBULATORY_CARE_PROVIDER_SITE_OTHER): Payer: Self-pay | Admitting: Vascular Surgery

## 2023-09-25 ENCOUNTER — Encounter
Admission: RE | Disposition: A | Discharge status: Home / Self Care | Payer: Self-pay | Source: Home / Self Care | Attending: Gastroenterology

## 2023-09-25 ENCOUNTER — Encounter: Payer: Self-pay | Admitting: Gastroenterology

## 2023-09-25 ENCOUNTER — Ambulatory Visit
Admission: RE | Admit: 2023-09-25 | Discharge: 2023-09-25 | Disposition: A | Discharge status: Home / Self Care | Attending: Gastroenterology | Admitting: Gastroenterology

## 2023-09-25 ENCOUNTER — Other Ambulatory Visit: Payer: Self-pay

## 2023-09-25 DIAGNOSIS — I70219 Atherosclerosis of native arteries of extremities with intermittent claudication, unspecified extremity: Secondary | ICD-10-CM

## 2023-09-25 HISTORY — DX: Deficiency of other specified B group vitamins: E53.8

## 2023-09-25 SURGERY — COLONOSCOPY
Anesthesia: General

## 2023-09-25 MED ORDER — SODIUM CHLORIDE 0.9 % IV SOLN: INTRAVENOUS | Status: DC

## 2023-09-25 NOTE — OR Nursing (Signed)
 PT ARRIVED TO UNIT. ATE HAM BISCUIT  YESTERDAY. STOOL BROWN. DR RUSSO NOTIFIED AND PT WILL BE RE-SCHEDULED

## 2023-09-25 NOTE — H&P (Signed)
 Patient was not completely prepped with brown stool still passing. He will reschedule his colonoscopy.  Elspeth EMERSON Jungling, DO [facility]

## 2023-10-01 ENCOUNTER — Ambulatory Visit (INDEPENDENT_AMBULATORY_CARE_PROVIDER_SITE_OTHER)

## 2023-10-01 ENCOUNTER — Other Ambulatory Visit (INDEPENDENT_AMBULATORY_CARE_PROVIDER_SITE_OTHER): Payer: Self-pay | Admitting: Vascular Surgery

## 2023-10-01 DIAGNOSIS — I739 Peripheral vascular disease, unspecified: Secondary | ICD-10-CM | POA: Diagnosis not present

## 2023-10-01 DIAGNOSIS — R6889 Other general symptoms and signs: Secondary | ICD-10-CM

## 2023-10-01 DIAGNOSIS — I70219 Atherosclerosis of native arteries of extremities with intermittent claudication, unspecified extremity: Secondary | ICD-10-CM | POA: Diagnosis not present

## 2023-10-06 ENCOUNTER — Ambulatory Visit (INDEPENDENT_AMBULATORY_CARE_PROVIDER_SITE_OTHER): Admitting: Nurse Practitioner

## 2023-10-06 ENCOUNTER — Encounter (INDEPENDENT_AMBULATORY_CARE_PROVIDER_SITE_OTHER): Payer: Self-pay | Admitting: Nurse Practitioner

## 2023-10-06 VITALS — BP 145/92 | HR 78 | Resp 18 | Ht 70.0 in | Wt 170.4 lb

## 2023-10-06 DIAGNOSIS — I70213 Atherosclerosis of native arteries of extremities with intermittent claudication, bilateral legs: Secondary | ICD-10-CM

## 2023-10-06 DIAGNOSIS — M170 Bilateral primary osteoarthritis of knee: Secondary | ICD-10-CM | POA: Diagnosis not present

## 2023-10-06 NOTE — H&P (View-Only) (Signed)
 Subjective:    Patient ID: Danny Castro, male    DOB: 1951/02/26, 73 y.o.   MRN: [id number] Chief Complaint  Patient presents with   Follow-up    ABI + consult Ref. [facility]   Establish Care    The patient is a 73 year old male who is referred by his primary care provider Dr. Cleotilde with concern for worsening claudication symptoms.  He works a very active job and notes that for the last 2 months or so he has been having a much more difficult time with everyday activities.  He notes that when he is picking up and loading heavy things and walking short distances it becomes harder for him to walk without breaks.  He notes that his calves tightening cramp up and he has to stop and rest before he can walk further.  He has had a few nights with some concerning rest pain symptoms.  However he currently denies any open wounds or ulcerations.  Today noninvasive study showed ABI 0.66 on the right and 0.71 on the left.  Additional bilateral arterial duplexes show an occlusion of the mid SFA of the right lower extremity.  He has biphasic waveforms prior to this point where it transitions to monophasic waveforms.  The left lower extremity has a nearly occlusive stenosis also at the mid SFA.  It is also noted that he transitions to monophasic waveforms following the mid SFA portion.    Review of Systems  Cardiovascular:  Negative for leg swelling.  Musculoskeletal:  Positive for arthralgias.  All other systems reviewed and are negative.      Objective:   Physical Exam Vitals reviewed.  HENT:     Head: Normocephalic.  Cardiovascular:     Rate and Rhythm: Normal rate.     Pulses:          Dorsalis pedis pulses are detected w/ Doppler on the right side and detected w/ Doppler on the left side.       Posterior tibial pulses are detected w/ Doppler on the right side and detected w/ Doppler on the left side.  Pulmonary:     Effort: Pulmonary effort is normal.  Skin:    General: Skin is warm and dry.   Neurological:     Mental Status: He is alert and oriented to person, place, and time.  Psychiatric:        Mood and Affect: Mood normal.        Behavior: Behavior normal.        Thought Content: Thought content normal.        Judgment: Judgment normal.     BP (!) 145/92 (BP Location: Left Arm, Patient Position: Sitting, Cuff Size: Normal)   Pulse 78   Resp 18   Ht 5' 10 (1.778 m)   Wt 170 lb 6.4 oz (77.3 kg)   BMI 24.45 kg/m   Past Medical History:  Diagnosis Date   B12 deficiency    Calculus of kidney 08/19/2013   History of kidney stones    Nephrolithiasis    OA (osteoarthritis)    Primary osteoarthritis of both knees 04/10/2014   Primary osteoarthritis of both knees     Social History   Socioeconomic History   Marital status: Married    Spouse name: Dolores   Number of children: Not on file   Years of education: Not on file   Highest education level: Not on file  Occupational History   Not on file  Tobacco Use  Smoking status: Former    Current packs/day: 0.00    Types: Cigarettes    Quit date: 11/29/2010    Years since quitting: 12.8    Passive exposure: Past   Smokeless tobacco: Never  Vaping Use   Vaping status: Never Used  Substance and Sexual Activity   Alcohol use: Yes    Alcohol/week: 0.0 standard drinks of alcohol   Drug use: No   Sexual activity: Yes  Other Topics Concern   Not on file  Social History Narrative   Not on file   Social Drivers of Health   Financial Resource Strain: Low Risk  (08/14/2023)   Received from [facility]   Overall Financial Resource Strain (CARDIA)    Difficulty of Paying Living Expenses: Not hard at all  Food Insecurity: No Food Insecurity (08/14/2023)   Received from [facility]   Hunger Vital Sign    Within the past 12 months, you worried that your food would run out before you got the money to buy more.: Never true    Within the past 12 months, the food you bought just  didn't last and you didn't have money to get more.: Never true  Transportation Needs: No Transportation Needs (08/14/2023)   Received from [facility] - Transportation    In the past 12 months, has lack of transportation kept you from medical appointments or from getting medications?: No    Lack of Transportation (Non-Medical): No  Physical Activity: Not on file  Stress: Not on file  Social Connections: Not on file  Intimate Partner Violence: Not on file    Past Surgical History:  Procedure Laterality Date   COLONOSCOPY     CYSTOSCOPY W/ URETERAL STENT PLACEMENT Right 04/04/2016   Procedure: CYSTOSCOPY WITH STENT REPLACEMENT;  Surgeon: Redell Lynwood Napoleon, MD;  Location: [facility];  Service: Urology;  Laterality: Right;   CYSTOSCOPY WITH STENT PLACEMENT Right 03/13/2016   Procedure: CYSTOSCOPY WITH STENT PLACEMENT;  Surgeon: Redell Lynwood Napoleon, MD;  Location: [facility];  Service: Urology;  Laterality: Right;   CYSTOSCOPY/URETEROSCOPY/HOLMIUM LASER/STENT PLACEMENT Right 06/13/2022   Procedure: CYSTOSCOPY/URETEROSCOPY/HOLMIUM LASER/STENT PLACEMENT;  Surgeon: Francisca Redell BROCKS, MD;  Location: [facility];  Service: Urology;  Laterality: Right;   EXTRACORPOREAL SHOCK WAVE LITHOTRIPSY Right 01/04/2015   Procedure: EXTRACORPOREAL SHOCK WAVE LITHOTRIPSY (ESWL);  Surgeon: Charlie JONETTA Pack, MD;  Location: [facility];  Service: Urology;  Laterality: Right;   EXTRACORPOREAL SHOCK WAVE LITHOTRIPSY N/A 07/08/2018   Procedure: EXTRACORPOREAL SHOCK WAVE LITHOTRIPSY (ESWL);  Surgeon: Penne Knee, MD;  Location: [facility];  Service: Urology;  Laterality: N/A;   EXTRACORPOREAL SHOCK WAVE LITHOTRIPSY Right 02/21/2021   Procedure: EXTRACORPOREAL SHOCK WAVE LITHOTRIPSY (ESWL);  Surgeon: Francisca Redell BROCKS, MD;  Location: [facility];  Service: Urology;  Laterality: Right;   TONSILLECTOMY     URETEROSCOPY Right 03/13/2016   Procedure: URETEROSCOPY;  Surgeon: Redell Lynwood Napoleon, MD;  Location: [facility]  [facility];  Service: Urology;  Laterality: Right;   URETEROSCOPY WITH HOLMIUM LASER LITHOTRIPSY Right 04/04/2016   Procedure: URETEROSCOPY WITH HOLMIUM LASER LITHOTRIPSY;  Surgeon: Redell Lynwood Napoleon, MD;  Location: [facility];  Service: Urology;  Laterality: Right;    Family History  Problem Relation Age of Onset   Prostate cancer Father    Nephrolithiasis Father     Allergies  Allergen Reactions   Prednisone Anxiety       Latest Ref Rng & Units 12/04/2021    2:24 PM 02/19/2021  4:00 AM 02/11/2021    4:09 AM  CBC  WBC 4.0 - 10.5 K/uL 9.8  15.9  10.8   Hemoglobin 13.0 - 17.0 g/dL 84.6  85.1  85.2   Hematocrit 39.0 - 52.0 % 46.8  45.2  44.0   Platelets 150 - 400 K/uL 261  268  244       CMP     Component Value Date/Time   NA 137 12/04/2021 1424   K 3.9 12/04/2021 1424   CL 102 12/04/2021 1424   CO2 28 12/04/2021 1424   GLUCOSE 87 12/04/2021 1424   BUN 25 (H) 12/04/2021 1424   CREATININE 0.92 12/04/2021 1424   CALCIUM 9.1 12/04/2021 1424   PROT 7.7 12/04/2021 1424   ALBUMIN 4.4 12/04/2021 1424   AST 33 12/04/2021 1424   ALT 28 12/04/2021 1424   ALKPHOS 60 12/04/2021 1424   BILITOT 1.1 12/04/2021 1424   GFRNONAA >60 12/04/2021 1424     VAS US  ABI WITH/WO TBI Result Date: 10/06/2023  LOWER EXTREMITY DOPPLER STUDY Patient Name:  CLEMON DEVAUL  Date of Exam:   10/01/2023 Medical Rec #: [id number]     Accession #:    [id number] Date of Birth: 17-Apr-1950      Patient Gender: M Patient Age:   61 years Exam Location:  [facility] Procedure:      VAS US  ABI WITH/WO TBI Referring Phys: GREGORY SCHNIER --------------------------------------------------------------------------------  Indications: Peripheral artery disease.  Performing Technologist: Elsie Churn RT, RDMS, RVT  Examination Guidelines: A complete evaluation includes at minimum, Doppler waveform signals and systolic blood pressure reading at the level of bilateral brachial, anterior tibial, and posterior tibial  arteries, when vessel segments are accessible. Bilateral testing is considered an integral part of a complete examination. Photoelectric Plethysmograph (PPG) waveforms and toe systolic pressure readings are included as required and additional duplex testing as needed. Limited examinations for reoccurring indications may be performed as noted.  ABI Findings: +---------+------------------+-----+-------------------+--------------------+ Right    Rt Pressure (mmHg)IndexWaveform           Comment              +---------+------------------+-----+-------------------+--------------------+ Brachial 158                                                            +---------+------------------+-----+-------------------+--------------------+ PTA                                                not detected         +---------+------------------+-----+-------------------+--------------------+ PERO     105               0.66 monophasic         collateral by duplex +---------+------------------+-----+-------------------+--------------------+ DP       101               0.64 dampened monophasic                     +---------+------------------+-----+-------------------+--------------------+ Great Toe61                0.39 Abnormal                                +---------+------------------+-----+-------------------+--------------------+ +---------+------------------+-----+-------------------+------------+  Left     Lt Pressure (mmHg)IndexWaveform           Comment      +---------+------------------+-----+-------------------+------------+ Brachial 148                                                    +---------+------------------+-----+-------------------+------------+ PTA                                                not detected +---------+------------------+-----+-------------------+------------+ PERO     95                0.60 dampened monophasic              +---------+------------------+-----+-------------------+------------+ DP       112               0.71 monophasic                      +---------+------------------+-----+-------------------+------------+ Great Toe81                0.51 Abnormal                        +---------+------------------+-----+-------------------+------------+  Summary: Bilateral: Resting bilateral ankle-brachial index indicates moderate lower extremity arterial disease. Bilateral toe-brachial indexes are abnormal.  *See table(s) above for measurements and observations.  Electronically signed by Cordella Shawl MD on 10/06/2023 at 7:19:23 AM.    Final        Assessment & Plan:   1. Atherosclerosis of native artery of both lower extremities with intermittent claudication (HCC) (Primary) Recommend:  The patient has experienced increased claudication symptoms and is now describing lifestyle limiting claudication and appears to be having mild rest pain symptroms.  Given the severity of the patient's severe bilateral lower extremity symptoms the patient should undergo angiography with the hope for intervention.  Risk and benefits were reviewed the patient.  Indications for the procedure were reviewed.  All questions were answered, the patient agrees to proceed with left and right lower extremity angiography and possible intervention.   The patient should continue walking and begin a more formal exercise program.  The patient should continue antiplatelet therapy and aggressive treatment of the lipid abnormalities  The patient will follow up with me after the angiogram.   2. Primary osteoarthritis of both knees Continue medications to treat the patient's degenerative disease as already ordered, these medications have been reviewed and there are no changes at this time.  Continued activity and therapy was stressed.   Current Outpatient Medications on File Prior to Visit  Medication Sig Dispense Refill    azelastine (ASTELIN) 0.1 % nasal spray Place 1 spray into both nostrils 2 (two) times daily.     meloxicam  (MOBIC ) 15 MG tablet Take 1 tablet (15 mg total) by mouth daily. 30 tablet 0   olmesartan (BENICAR) 20 MG tablet Take 20 mg by mouth daily.     tamsulosin  (FLOMAX ) 0.4 MG CAPS capsule Take 1 capsule (0.4 mg total) by mouth daily after supper. 14 capsule 0   No current facility-administered medications on file prior to visit.    There are no Patient Instructions on file for this  visit. No follow-ups on file.   Shawndell Varas E Abygale Karpf, NP

## 2023-10-06 NOTE — Progress Notes (Signed)
 Subjective:    Patient ID: Danny Castro, male    DOB: 1951/02/26, 73 y.o.   MRN: [id number] Chief Complaint  Patient presents with   Follow-up    ABI + consult Ref. [facility]   Establish Care    The patient is a 73 year old male who is referred by his primary care provider Dr. Cleotilde with concern for worsening claudication symptoms.  He works a very active job and notes that for the last 2 months or so he has been having a much more difficult time with everyday activities.  He notes that when he is picking up and loading heavy things and walking short distances it becomes harder for him to walk without breaks.  He notes that his calves tightening cramp up and he has to stop and rest before he can walk further.  He has had a few nights with some concerning rest pain symptoms.  However he currently denies any open wounds or ulcerations.  Today noninvasive study showed ABI 0.66 on the right and 0.71 on the left.  Additional bilateral arterial duplexes show an occlusion of the mid SFA of the right lower extremity.  He has biphasic waveforms prior to this point where it transitions to monophasic waveforms.  The left lower extremity has a nearly occlusive stenosis also at the mid SFA.  It is also noted that he transitions to monophasic waveforms following the mid SFA portion.    Review of Systems  Cardiovascular:  Negative for leg swelling.  Musculoskeletal:  Positive for arthralgias.  All other systems reviewed and are negative.      Objective:   Physical Exam Vitals reviewed.  HENT:     Head: Normocephalic.  Cardiovascular:     Rate and Rhythm: Normal rate.     Pulses:          Dorsalis pedis pulses are detected w/ Doppler on the right side and detected w/ Doppler on the left side.       Posterior tibial pulses are detected w/ Doppler on the right side and detected w/ Doppler on the left side.  Pulmonary:     Effort: Pulmonary effort is normal.  Skin:    General: Skin is warm and dry.   Neurological:     Mental Status: He is alert and oriented to person, place, and time.  Psychiatric:        Mood and Affect: Mood normal.        Behavior: Behavior normal.        Thought Content: Thought content normal.        Judgment: Judgment normal.     BP (!) 145/92 (BP Location: Left Arm, Patient Position: Sitting, Cuff Size: Normal)   Pulse 78   Resp 18   Ht 5' 10 (1.778 m)   Wt 170 lb 6.4 oz (77.3 kg)   BMI 24.45 kg/m   Past Medical History:  Diagnosis Date   B12 deficiency    Calculus of kidney 08/19/2013   History of kidney stones    Nephrolithiasis    OA (osteoarthritis)    Primary osteoarthritis of both knees 04/10/2014   Primary osteoarthritis of both knees     Social History   Socioeconomic History   Marital status: Married    Spouse name: Dolores   Number of children: Not on file   Years of education: Not on file   Highest education level: Not on file  Occupational History   Not on file  Tobacco Use  Smoking status: Former    Current packs/day: 0.00    Types: Cigarettes    Quit date: 11/29/2010    Years since quitting: 12.8    Passive exposure: Past   Smokeless tobacco: Never  Vaping Use   Vaping status: Never Used  Substance and Sexual Activity   Alcohol use: Yes    Alcohol/week: 0.0 standard drinks of alcohol   Drug use: No   Sexual activity: Yes  Other Topics Concern   Not on file  Social History Narrative   Not on file   Social Drivers of Health   Financial Resource Strain: Low Risk  (08/14/2023)   Received from [facility]   Overall Financial Resource Strain (CARDIA)    Difficulty of Paying Living Expenses: Not hard at all  Food Insecurity: No Food Insecurity (08/14/2023)   Received from [facility]   Hunger Vital Sign    Within the past 12 months, you worried that your food would run out before you got the money to buy more.: Never true    Within the past 12 months, the food you bought just  didn't last and you didn't have money to get more.: Never true  Transportation Needs: No Transportation Needs (08/14/2023)   Received from [facility] - Transportation    In the past 12 months, has lack of transportation kept you from medical appointments or from getting medications?: No    Lack of Transportation (Non-Medical): No  Physical Activity: Not on file  Stress: Not on file  Social Connections: Not on file  Intimate Partner Violence: Not on file    Past Surgical History:  Procedure Laterality Date   COLONOSCOPY     CYSTOSCOPY W/ URETERAL STENT PLACEMENT Right 04/04/2016   Procedure: CYSTOSCOPY WITH STENT REPLACEMENT;  Surgeon: Redell Lynwood Napoleon, MD;  Location: [facility];  Service: Urology;  Laterality: Right;   CYSTOSCOPY WITH STENT PLACEMENT Right 03/13/2016   Procedure: CYSTOSCOPY WITH STENT PLACEMENT;  Surgeon: Redell Lynwood Napoleon, MD;  Location: [facility];  Service: Urology;  Laterality: Right;   CYSTOSCOPY/URETEROSCOPY/HOLMIUM LASER/STENT PLACEMENT Right 06/13/2022   Procedure: CYSTOSCOPY/URETEROSCOPY/HOLMIUM LASER/STENT PLACEMENT;  Surgeon: Francisca Redell BROCKS, MD;  Location: [facility];  Service: Urology;  Laterality: Right;   EXTRACORPOREAL SHOCK WAVE LITHOTRIPSY Right 01/04/2015   Procedure: EXTRACORPOREAL SHOCK WAVE LITHOTRIPSY (ESWL);  Surgeon: Charlie JONETTA Pack, MD;  Location: [facility];  Service: Urology;  Laterality: Right;   EXTRACORPOREAL SHOCK WAVE LITHOTRIPSY N/A 07/08/2018   Procedure: EXTRACORPOREAL SHOCK WAVE LITHOTRIPSY (ESWL);  Surgeon: Penne Knee, MD;  Location: [facility];  Service: Urology;  Laterality: N/A;   EXTRACORPOREAL SHOCK WAVE LITHOTRIPSY Right 02/21/2021   Procedure: EXTRACORPOREAL SHOCK WAVE LITHOTRIPSY (ESWL);  Surgeon: Francisca Redell BROCKS, MD;  Location: [facility];  Service: Urology;  Laterality: Right;   TONSILLECTOMY     URETEROSCOPY Right 03/13/2016   Procedure: URETEROSCOPY;  Surgeon: Redell Lynwood Napoleon, MD;  Location: [facility]  [facility];  Service: Urology;  Laterality: Right;   URETEROSCOPY WITH HOLMIUM LASER LITHOTRIPSY Right 04/04/2016   Procedure: URETEROSCOPY WITH HOLMIUM LASER LITHOTRIPSY;  Surgeon: Redell Lynwood Napoleon, MD;  Location: [facility];  Service: Urology;  Laterality: Right;    Family History  Problem Relation Age of Onset   Prostate cancer Father    Nephrolithiasis Father     Allergies  Allergen Reactions   Prednisone Anxiety       Latest Ref Rng & Units 12/04/2021    2:24 PM 02/19/2021  4:00 AM 02/11/2021    4:09 AM  CBC  WBC 4.0 - 10.5 K/uL 9.8  15.9  10.8   Hemoglobin 13.0 - 17.0 g/dL 84.6  85.1  85.2   Hematocrit 39.0 - 52.0 % 46.8  45.2  44.0   Platelets 150 - 400 K/uL 261  268  244       CMP     Component Value Date/Time   NA 137 12/04/2021 1424   K 3.9 12/04/2021 1424   CL 102 12/04/2021 1424   CO2 28 12/04/2021 1424   GLUCOSE 87 12/04/2021 1424   BUN 25 (H) 12/04/2021 1424   CREATININE 0.92 12/04/2021 1424   CALCIUM 9.1 12/04/2021 1424   PROT 7.7 12/04/2021 1424   ALBUMIN 4.4 12/04/2021 1424   AST 33 12/04/2021 1424   ALT 28 12/04/2021 1424   ALKPHOS 60 12/04/2021 1424   BILITOT 1.1 12/04/2021 1424   GFRNONAA >60 12/04/2021 1424     VAS US  ABI WITH/WO TBI Result Date: 10/06/2023  LOWER EXTREMITY DOPPLER STUDY Patient Name:  CLEMON DEVAUL  Date of Exam:   10/01/2023 Medical Rec #: [id number]     Accession #:    [id number] Date of Birth: 17-Apr-1950      Patient Gender: M Patient Age:   61 years Exam Location:  [facility] Procedure:      VAS US  ABI WITH/WO TBI Referring Phys: GREGORY SCHNIER --------------------------------------------------------------------------------  Indications: Peripheral artery disease.  Performing Technologist: Elsie Churn RT, RDMS, RVT  Examination Guidelines: A complete evaluation includes at minimum, Doppler waveform signals and systolic blood pressure reading at the level of bilateral brachial, anterior tibial, and posterior tibial  arteries, when vessel segments are accessible. Bilateral testing is considered an integral part of a complete examination. Photoelectric Plethysmograph (PPG) waveforms and toe systolic pressure readings are included as required and additional duplex testing as needed. Limited examinations for reoccurring indications may be performed as noted.  ABI Findings: +---------+------------------+-----+-------------------+--------------------+ Right    Rt Pressure (mmHg)IndexWaveform           Comment              +---------+------------------+-----+-------------------+--------------------+ Brachial 158                                                            +---------+------------------+-----+-------------------+--------------------+ PTA                                                not detected         +---------+------------------+-----+-------------------+--------------------+ PERO     105               0.66 monophasic         collateral by duplex +---------+------------------+-----+-------------------+--------------------+ DP       101               0.64 dampened monophasic                     +---------+------------------+-----+-------------------+--------------------+ Great Toe61                0.39 Abnormal                                +---------+------------------+-----+-------------------+--------------------+ +---------+------------------+-----+-------------------+------------+  Left     Lt Pressure (mmHg)IndexWaveform           Comment      +---------+------------------+-----+-------------------+------------+ Brachial 148                                                    +---------+------------------+-----+-------------------+------------+ PTA                                                not detected +---------+------------------+-----+-------------------+------------+ PERO     95                0.60 dampened monophasic              +---------+------------------+-----+-------------------+------------+ DP       112               0.71 monophasic                      +---------+------------------+-----+-------------------+------------+ Great Toe81                0.51 Abnormal                        +---------+------------------+-----+-------------------+------------+  Summary: Bilateral: Resting bilateral ankle-brachial index indicates moderate lower extremity arterial disease. Bilateral toe-brachial indexes are abnormal.  *See table(s) above for measurements and observations.  Electronically signed by Cordella Shawl MD on 10/06/2023 at 7:19:23 AM.    Final        Assessment & Plan:   1. Atherosclerosis of native artery of both lower extremities with intermittent claudication (HCC) (Primary) Recommend:  The patient has experienced increased claudication symptoms and is now describing lifestyle limiting claudication and appears to be having mild rest pain symptroms.  Given the severity of the patient's severe bilateral lower extremity symptoms the patient should undergo angiography with the hope for intervention.  Risk and benefits were reviewed the patient.  Indications for the procedure were reviewed.  All questions were answered, the patient agrees to proceed with left and right lower extremity angiography and possible intervention.   The patient should continue walking and begin a more formal exercise program.  The patient should continue antiplatelet therapy and aggressive treatment of the lipid abnormalities  The patient will follow up with me after the angiogram.   2. Primary osteoarthritis of both knees Continue medications to treat the patient's degenerative disease as already ordered, these medications have been reviewed and there are no changes at this time.  Continued activity and therapy was stressed.   Current Outpatient Medications on File Prior to Visit  Medication Sig Dispense Refill    azelastine (ASTELIN) 0.1 % nasal spray Place 1 spray into both nostrils 2 (two) times daily.     meloxicam  (MOBIC ) 15 MG tablet Take 1 tablet (15 mg total) by mouth daily. 30 tablet 0   olmesartan (BENICAR) 20 MG tablet Take 20 mg by mouth daily.     tamsulosin  (FLOMAX ) 0.4 MG CAPS capsule Take 1 capsule (0.4 mg total) by mouth daily after supper. 14 capsule 0   No current facility-administered medications on file prior to visit.    There are no Patient Instructions on file for this  visit. No follow-ups on file.   Shawndell Varas E Abygale Karpf, NP

## 2023-10-06 NOTE — H&P (View-Only) (Signed)
 Subjective:    Patient ID: Danny Castro, male    DOB: 1951/02/26, 73 y.o.   MRN: [id number] Chief Complaint  Patient presents with   Follow-up    ABI + consult Ref. [facility]   Establish Care    The patient is a 73 year old male who is referred by his primary care provider Dr. Cleotilde with concern for worsening claudication symptoms.  He works a very active job and notes that for the last 2 months or so he has been having a much more difficult time with everyday activities.  He notes that when he is picking up and loading heavy things and walking short distances it becomes harder for him to walk without breaks.  He notes that his calves tightening cramp up and he has to stop and rest before he can walk further.  He has had a few nights with some concerning rest pain symptoms.  However he currently denies any open wounds or ulcerations.  Today noninvasive study showed ABI 0.66 on the right and 0.71 on the left.  Additional bilateral arterial duplexes show an occlusion of the mid SFA of the right lower extremity.  He has biphasic waveforms prior to this point where it transitions to monophasic waveforms.  The left lower extremity has a nearly occlusive stenosis also at the mid SFA.  It is also noted that he transitions to monophasic waveforms following the mid SFA portion.    Review of Systems  Cardiovascular:  Negative for leg swelling.  Musculoskeletal:  Positive for arthralgias.  All other systems reviewed and are negative.      Objective:   Physical Exam Vitals reviewed.  HENT:     Head: Normocephalic.  Cardiovascular:     Rate and Rhythm: Normal rate.     Pulses:          Dorsalis pedis pulses are detected w/ Doppler on the right side and detected w/ Doppler on the left side.       Posterior tibial pulses are detected w/ Doppler on the right side and detected w/ Doppler on the left side.  Pulmonary:     Effort: Pulmonary effort is normal.  Skin:    General: Skin is warm and dry.   Neurological:     Mental Status: He is alert and oriented to person, place, and time.  Psychiatric:        Mood and Affect: Mood normal.        Behavior: Behavior normal.        Thought Content: Thought content normal.        Judgment: Judgment normal.     BP (!) 145/92 (BP Location: Left Arm, Patient Position: Sitting, Cuff Size: Normal)   Pulse 78   Resp 18   Ht 5' 10 (1.778 m)   Wt 170 lb 6.4 oz (77.3 kg)   BMI 24.45 kg/m   Past Medical History:  Diagnosis Date   B12 deficiency    Calculus of kidney 08/19/2013   History of kidney stones    Nephrolithiasis    OA (osteoarthritis)    Primary osteoarthritis of both knees 04/10/2014   Primary osteoarthritis of both knees     Social History   Socioeconomic History   Marital status: Married    Spouse name: Dolores   Number of children: Not on file   Years of education: Not on file   Highest education level: Not on file  Occupational History   Not on file  Tobacco Use  Smoking status: Former    Current packs/day: 0.00    Types: Cigarettes    Quit date: 11/29/2010    Years since quitting: 12.8    Passive exposure: Past   Smokeless tobacco: Never  Vaping Use   Vaping status: Never Used  Substance and Sexual Activity   Alcohol use: Yes    Alcohol/week: 0.0 standard drinks of alcohol   Drug use: No   Sexual activity: Yes  Other Topics Concern   Not on file  Social History Narrative   Not on file   Social Drivers of Health   Financial Resource Strain: Low Risk  (08/14/2023)   Received from [facility]   Overall Financial Resource Strain (CARDIA)    Difficulty of Paying Living Expenses: Not hard at all  Food Insecurity: No Food Insecurity (08/14/2023)   Received from [facility]   Hunger Vital Sign    Within the past 12 months, you worried that your food would run out before you got the money to buy more.: Never true    Within the past 12 months, the food you bought just  didn't last and you didn't have money to get more.: Never true  Transportation Needs: No Transportation Needs (08/14/2023)   Received from [facility] Transportation    In the past 12 months, has lack of transportation kept you from medical appointments or from getting medications?: No    Lack of Transportation (Non-Medical): No  Physical Activity: Not on file  Stress: Not on file  Social Connections: Not on file  Intimate Partner Violence: Not on file    Past Surgical History:  Procedure Laterality Date   COLONOSCOPY     CYSTOSCOPY W/ URETERAL STENT PLACEMENT Right 04/04/2016   Procedure: CYSTOSCOPY WITH STENT REPLACEMENT;  Surgeon: Redell Lynwood Napoleon, MD;  Location: [facility];  Service: Urology;  Laterality: Right;   CYSTOSCOPY WITH STENT PLACEMENT Right 03/13/2016   Procedure: CYSTOSCOPY WITH STENT PLACEMENT;  Surgeon: Redell Lynwood Napoleon, MD;  Location: [facility];  Service: Urology;  Laterality: Right;   CYSTOSCOPY/URETEROSCOPY/HOLMIUM LASER/STENT PLACEMENT Right 06/13/2022   Procedure: CYSTOSCOPY/URETEROSCOPY/HOLMIUM LASER/STENT PLACEMENT;  Surgeon: Francisca Redell BROCKS, MD;  Location: [facility];  Service: Urology;  Laterality: Right;   EXTRACORPOREAL SHOCK WAVE LITHOTRIPSY Right 01/04/2015   Procedure: EXTRACORPOREAL SHOCK WAVE LITHOTRIPSY (ESWL);  Surgeon: Charlie JONETTA Pack, MD;  Location: [facility];  Service: Urology;  Laterality: Right;   EXTRACORPOREAL SHOCK WAVE LITHOTRIPSY N/A 07/08/2018   Procedure: EXTRACORPOREAL SHOCK WAVE LITHOTRIPSY (ESWL);  Surgeon: Penne Knee, MD;  Location: [facility];  Service: Urology;  Laterality: N/A;   EXTRACORPOREAL SHOCK WAVE LITHOTRIPSY Right 02/21/2021   Procedure: EXTRACORPOREAL SHOCK WAVE LITHOTRIPSY (ESWL);  Surgeon: Francisca Redell BROCKS, MD;  Location: [facility];  Service: Urology;  Laterality: Right;   TONSILLECTOMY     URETEROSCOPY Right 03/13/2016   Procedure: URETEROSCOPY;  Surgeon: Redell Lynwood Napoleon, MD;  Location: [facility]  [facility];  Service: Urology;  Laterality: Right;   URETEROSCOPY WITH HOLMIUM LASER LITHOTRIPSY Right 04/04/2016   Procedure: URETEROSCOPY WITH HOLMIUM LASER LITHOTRIPSY;  Surgeon: Redell Lynwood Napoleon, MD;  Location: [facility];  Service: Urology;  Laterality: Right;    Family History  Problem Relation Age of Onset   Prostate cancer Father    Nephrolithiasis Father     Allergies  Allergen Reactions   Prednisone Anxiety       Latest Ref Rng & Units 12/04/2021    2:24 PM 02/19/2021  4:00 AM 02/11/2021    4:09 AM  CBC  WBC 4.0 - 10.5 K/uL 9.8  15.9  10.8   Hemoglobin 13.0 - 17.0 g/dL 84.6  85.1  85.2   Hematocrit 39.0 - 52.0 % 46.8  45.2  44.0   Platelets 150 - 400 K/uL 261  268  244       CMP     Component Value Date/Time   NA 137 12/04/2021 1424   K 3.9 12/04/2021 1424   CL 102 12/04/2021 1424   CO2 28 12/04/2021 1424   GLUCOSE 87 12/04/2021 1424   BUN 25 (H) 12/04/2021 1424   CREATININE 0.92 12/04/2021 1424   CALCIUM 9.1 12/04/2021 1424   PROT 7.7 12/04/2021 1424   ALBUMIN 4.4 12/04/2021 1424   AST 33 12/04/2021 1424   ALT 28 12/04/2021 1424   ALKPHOS 60 12/04/2021 1424   BILITOT 1.1 12/04/2021 1424   GFRNONAA >60 12/04/2021 1424     VAS US  ABI WITH/WO TBI Result Date: 10/06/2023  LOWER EXTREMITY DOPPLER STUDY Patient Name:  Danny Castro  Date of Exam:   10/01/2023 Medical Rec #: [id number]     Accession #:    [id number] Date of Birth: 17-Apr-1950      Patient Gender: M Patient Age:   61 years Exam Location:  [facility] Procedure:      VAS US  ABI WITH/WO TBI Referring Phys: GREGORY SCHNIER --------------------------------------------------------------------------------  Indications: Peripheral artery disease.  Performing Technologist: Elsie Churn RT, RDMS, RVT  Examination Guidelines: A complete evaluation includes at minimum, Doppler waveform signals and systolic blood pressure reading at the level of bilateral brachial, anterior tibial, and posterior tibial  arteries, when vessel segments are accessible. Bilateral testing is considered an integral part of a complete examination. Photoelectric Plethysmograph (PPG) waveforms and toe systolic pressure readings are included as required and additional duplex testing as needed. Limited examinations for reoccurring indications may be performed as noted.  ABI Findings: +---------+------------------+-----+-------------------+--------------------+ Right    Rt Pressure (mmHg)IndexWaveform           Comment              +---------+------------------+-----+-------------------+--------------------+ Brachial 158                                                            +---------+------------------+-----+-------------------+--------------------+ PTA                                                not detected         +---------+------------------+-----+-------------------+--------------------+ PERO     105               0.66 monophasic         collateral by duplex +---------+------------------+-----+-------------------+--------------------+ DP       101               0.64 dampened monophasic                     +---------+------------------+-----+-------------------+--------------------+ Great Toe61                0.39 Abnormal                                +---------+------------------+-----+-------------------+--------------------+ +---------+------------------+-----+-------------------+------------+  Left     Lt Pressure (mmHg)IndexWaveform           Comment      +---------+------------------+-----+-------------------+------------+ Brachial 148                                                    +---------+------------------+-----+-------------------+------------+ PTA                                                not detected +---------+------------------+-----+-------------------+------------+ PERO     95                0.60 dampened monophasic              +---------+------------------+-----+-------------------+------------+ DP       112               0.71 monophasic                      +---------+------------------+-----+-------------------+------------+ Great Toe81                0.51 Abnormal                        +---------+------------------+-----+-------------------+------------+  Summary: Bilateral: Resting bilateral ankle-brachial index indicates moderate lower extremity arterial disease. Bilateral toe-brachial indexes are abnormal.  *See table(s) above for measurements and observations.  Electronically signed by Cordella Shawl MD on 10/06/2023 at 7:19:23 AM.    Final        Assessment & Plan:   1. Atherosclerosis of native artery of both lower extremities with intermittent claudication (HCC) (Primary) Recommend:  The patient has experienced increased claudication symptoms and is now describing lifestyle limiting claudication and appears to be having mild rest pain symptroms.  Given the severity of the patient's severe bilateral lower extremity symptoms the patient should undergo angiography with the hope for intervention.  Risk and benefits were reviewed the patient.  Indications for the procedure were reviewed.  All questions were answered, the patient agrees to proceed with left and right lower extremity angiography and possible intervention.   The patient should continue walking and begin a more formal exercise program.  The patient should continue antiplatelet therapy and aggressive treatment of the lipid abnormalities  The patient will follow up with me after the angiogram.   2. Primary osteoarthritis of both knees Continue medications to treat the patient's degenerative disease as already ordered, these medications have been reviewed and there are no changes at this time.  Continued activity and therapy was stressed.   Current Outpatient Medications on File Prior to Visit  Medication Sig Dispense Refill    azelastine (ASTELIN) 0.1 % nasal spray Place 1 spray into both nostrils 2 (two) times daily.     meloxicam  (MOBIC ) 15 MG tablet Take 1 tablet (15 mg total) by mouth daily. 30 tablet 0   olmesartan (BENICAR) 20 MG tablet Take 20 mg by mouth daily.     tamsulosin  (FLOMAX ) 0.4 MG CAPS capsule Take 1 capsule (0.4 mg total) by mouth daily after supper. 14 capsule 0   No current facility-administered medications on file prior to visit.    There are no Patient Instructions on file for this  visit. No follow-ups on file.   Shawndell Varas E Abygale Karpf, NP

## 2023-10-08 ENCOUNTER — Telehealth (INDEPENDENT_AMBULATORY_CARE_PROVIDER_SITE_OTHER): Payer: Self-pay

## 2023-10-08 NOTE — Telephone Encounter (Signed)
 I spoke with the patient to schedule him for bilateral leg angios with Dr. Marea. RLE (10/29/23 - 11:00 am), LLE (11/05/23 - 11:00 am) to the [facility]. Pre-procedure instructions were discussed and will be sent to Mychart and mailed.

## 2023-10-09 ENCOUNTER — Encounter: Payer: Self-pay | Admitting: Gastroenterology

## 2023-10-09 ENCOUNTER — Ambulatory Visit: Admitting: Certified Registered"

## 2023-10-09 ENCOUNTER — Other Ambulatory Visit: Payer: Self-pay

## 2023-10-09 ENCOUNTER — Ambulatory Visit
Admission: RE | Admit: 2023-10-09 | Discharge: 2023-10-09 | Disposition: A | Discharge status: Home / Self Care | Attending: Gastroenterology | Admitting: Gastroenterology

## 2023-10-09 ENCOUNTER — Encounter
Admission: RE | Disposition: A | Discharge status: Home / Self Care | Payer: Self-pay | Source: Home / Self Care | Attending: Gastroenterology

## 2023-10-09 DIAGNOSIS — D123 Benign neoplasm of transverse colon: Secondary | ICD-10-CM | POA: Insufficient documentation

## 2023-10-09 DIAGNOSIS — D128 Benign neoplasm of rectum: Secondary | ICD-10-CM | POA: Insufficient documentation

## 2023-10-09 DIAGNOSIS — Z87891 Personal history of nicotine dependence: Secondary | ICD-10-CM | POA: Insufficient documentation

## 2023-10-09 DIAGNOSIS — Z1211 Encounter for screening for malignant neoplasm of colon: Secondary | ICD-10-CM | POA: Diagnosis present

## 2023-10-09 HISTORY — PX: HEMOSTASIS CLIP PLACEMENT: SHX6857

## 2023-10-09 HISTORY — PX: COLONOSCOPY: SHX5424

## 2023-10-09 HISTORY — PX: POLYPECTOMY: SHX149

## 2023-10-09 SURGERY — COLONOSCOPY
Anesthesia: General

## 2023-10-09 MED ORDER — LIDOCAINE HCL (PF) 2 % IJ SOLN
INTRAMUSCULAR | Status: DC | PRN
  Administered 2023-10-09: 100 mg via INTRADERMAL

## 2023-10-09 MED ORDER — PROPOFOL 500 MG/50ML IV EMUL
INTRAVENOUS | Status: DC | PRN
  Administered 2023-10-09: 130 ug/kg/min via INTRAVENOUS
  Administered 2023-10-09: 100 mg via INTRAVENOUS

## 2023-10-09 MED ORDER — LIDOCAINE HCL (PF) 2 % IJ SOLN
INTRAMUSCULAR | Status: AC
  Filled 2023-10-09: qty 5

## 2023-10-09 MED ORDER — PROPOFOL 10 MG/ML IV BOLUS
INTRAVENOUS | Status: AC
  Filled 2023-10-09: qty 40

## 2023-10-09 MED ORDER — SODIUM CHLORIDE 0.9 % IV SOLN: INTRAVENOUS | Status: DC

## 2023-10-09 NOTE — H&P (Signed)
 Corinn JONELLE Brooklyn, MD [facility], DHIP [address]  [place], [place] [address]  Main: [phone] Fax:  [phone] Pager: [phone]   Primary Care Physician:  Cleotilde Oneil FALCON, MD Primary Gastroenterologist:  Dr. Corinn JONELLE Brooklyn  Pre-Procedure History & Physical: HPI:  Danny Castro is a 73 y.o. male is here for an colonoscopy.   Past Medical History:  Diagnosis Date   B12 deficiency    Calculus of kidney 08/19/2013   History of kidney stones    Nephrolithiasis    OA (osteoarthritis)    Primary osteoarthritis of both knees 04/10/2014   Primary osteoarthritis of both knees     Past Surgical History:  Procedure Laterality Date   COLONOSCOPY     CYSTOSCOPY W/ URETERAL STENT PLACEMENT Right 04/04/2016   Procedure: CYSTOSCOPY WITH STENT REPLACEMENT;  Surgeon: Redell Lynwood Napoleon, MD;  Location: [facility];  Service: Urology;  Laterality: Right;   CYSTOSCOPY WITH STENT PLACEMENT Right 03/13/2016   Procedure: CYSTOSCOPY WITH STENT PLACEMENT;  Surgeon: Redell Lynwood Napoleon, MD;  Location: [facility];  Service: Urology;  Laterality: Right;   CYSTOSCOPY/URETEROSCOPY/HOLMIUM LASER/STENT PLACEMENT Right 06/13/2022   Procedure: CYSTOSCOPY/URETEROSCOPY/HOLMIUM LASER/STENT PLACEMENT;  Surgeon: Francisca Redell BROCKS, MD;  Location: [facility];  Service: Urology;  Laterality: Right;   EXTRACORPOREAL SHOCK WAVE LITHOTRIPSY Right 01/04/2015   Procedure: EXTRACORPOREAL SHOCK WAVE LITHOTRIPSY (ESWL);  Surgeon: Charlie JONETTA Pack, MD;  Location: [facility];  Service: Urology;  Laterality: Right;   EXTRACORPOREAL SHOCK WAVE LITHOTRIPSY N/A 07/08/2018   Procedure: EXTRACORPOREAL SHOCK WAVE LITHOTRIPSY (ESWL);  Surgeon: Penne Knee, MD;  Location: [facility];  Service: Urology;  Laterality: N/A;   EXTRACORPOREAL SHOCK WAVE LITHOTRIPSY Right 02/21/2021   Procedure: EXTRACORPOREAL SHOCK WAVE LITHOTRIPSY (ESWL);  Surgeon: Francisca Redell BROCKS, MD;  Location: [facility];  Service: Urology;   Laterality: Right;   TONSILLECTOMY     URETEROSCOPY Right 03/13/2016   Procedure: URETEROSCOPY;  Surgeon: Redell Lynwood Napoleon, MD;  Location: [facility];  Service: Urology;  Laterality: Right;   URETEROSCOPY WITH HOLMIUM LASER LITHOTRIPSY Right 04/04/2016   Procedure: URETEROSCOPY WITH HOLMIUM LASER LITHOTRIPSY;  Surgeon: Redell Lynwood Napoleon, MD;  Location: [facility];  Service: Urology;  Laterality: Right;    Prior to Admission medications   Medication Sig Start Date End Date Taking? Authorizing Provider  olmesartan (BENICAR) 20 MG tablet Take 20 mg by mouth daily.   Yes [provider]  azelastine (ASTELIN) 0.1 % nasal spray Place 1 spray into both nostrils 2 (two) times daily. 10/31/22 10/31/23  [provider]  meloxicam  (MOBIC ) 15 MG tablet Take 1 tablet (15 mg total) by mouth daily. 08/12/22   Tobie Franky SQUIBB, DPM  tamsulosin  (FLOMAX ) 0.4 MG CAPS capsule Take 1 capsule (0.4 mg total) by mouth daily after supper. Patient not taking: Reported on 10/09/2023 06/13/22   Francisca Redell BROCKS, MD    Allergies as of 09/29/2023 - Review Complete 09/25/2023  Allergen Reaction Noted   Prednisone Anxiety 05/12/2017    Family History  Problem Relation Age of Onset   Prostate cancer Father    Nephrolithiasis Father     Social History   Socioeconomic History   Marital status: Married    Spouse name: Dolores   Number of children: Not on file   Years of education: Not on file   Highest education level: Not on file  Occupational History   Not on file  Tobacco Use   Smoking status: Former    Current packs/day: 0.00  Types: Cigarettes    Quit date: 11/29/2010    Years since quitting: 12.8    Passive exposure: Past   Smokeless tobacco: Never  Vaping Use   Vaping status: Some Days   Substances: Flavoring  Substance and Sexual Activity   Alcohol use: Yes    Alcohol/week: 0.0 standard drinks of alcohol   Drug use: No   Sexual activity: Yes  Other Topics Concern   Not on file   Social History Narrative   Not on file   Social Drivers of Health   Financial Resource Strain: Low Risk  (08/14/2023)   Received from [facility]   Overall Financial Resource Strain (CARDIA)    Difficulty of Paying Living Expenses: Not hard at all  Food Insecurity: No Food Insecurity (08/14/2023)   Received from [facility]   Hunger Vital Sign    Within the past 12 months, you worried that your food would run out before you got the money to buy more.: Never true    Within the past 12 months, the food you bought just didn't last and you didn't have money to get more.: Never true  Transportation Needs: No Transportation Needs (08/14/2023)   Received from [facility]    In the past 12 months, has lack of transportation kept you from medical appointments or from getting medications?: No    Lack of Transportation (Non-Medical): No  Physical Activity: Not on file  Stress: Not on file  Social Connections: Not on file  Intimate Partner Violence: Not on file    Review of Systems: See HPI, otherwise negative ROS  Physical Exam: BP (!) 173/88   Pulse 66   Temp (!) 96.7 F (35.9 C)   Resp 16   Ht 5' 10 (1.778 m)   Wt 76.3 kg   SpO2 100%   BMI 24.13 kg/m  General:   Alert,  pleasant and cooperative in NAD Head:  Normocephalic and atraumatic. Neck:  Supple; no masses or thyromegaly. Lungs:  Clear throughout to auscultation.    Heart:  Regular rate and rhythm. Abdomen:  Soft, nontender and nondistended. Normal bowel sounds, without guarding, and without rebound.   Neurologic:  Alert and  oriented x4;  grossly normal neurologically.  Impression/Plan: Danny Castro is here for an colonoscopy to be performed for colon cancer screening  Risks, benefits, limitations, and alternatives regarding  colonoscopy have been reviewed with the patient.  Questions have been answered.  All parties agreeable.   Corinn Brooklyn, MD  10/09/2023, 8:43 AM

## 2023-10-09 NOTE — Anesthesia Preprocedure Evaluation (Signed)
 Anesthesia Evaluation  Patient identified by MRN, date of birth, ID band Patient awake    Reviewed: Allergy & Precautions, NPO status , Patient's Chart, lab work & pertinent test results  History of Anesthesia Complications Negative for: history of anesthetic complications  Airway Mallampati: III  TM Distance: <3 FB Neck ROM: full    Dental  (+) Chipped, Poor Dentition   Pulmonary neg pulmonary ROS, neg shortness of breath, former smoker   Pulmonary exam normal        Cardiovascular Exercise Tolerance: Good (-) angina Normal cardiovascular exam     Neuro/Psych negative neurological ROS  negative psych ROS   GI/Hepatic negative GI ROS, Neg liver ROS,neg GERD  ,,  Endo/Other  negative endocrine ROS    Renal/GU Renal disease  negative genitourinary   Musculoskeletal   Abdominal   Peds  Hematology negative hematology ROS (+)   Anesthesia Other Findings Past Medical History: No date: B12 deficiency 08/19/2013: Calculus of kidney No date: History of kidney stones No date: Nephrolithiasis No date: OA (osteoarthritis) 04/10/2014: Primary osteoarthritis of both knees No date: Primary osteoarthritis of both knees  Past Surgical History: No date: COLONOSCOPY 04/04/2016: CYSTOSCOPY W/ URETERAL STENT PLACEMENT; Right     Comment:  Procedure: CYSTOSCOPY WITH STENT REPLACEMENT;  Surgeon:               Redell Lynwood Napoleon, MD;  Location: [facility];  Service:               Urology;  Laterality: Right; 03/13/2016: CYSTOSCOPY WITH STENT PLACEMENT; Right     Comment:  Procedure: CYSTOSCOPY WITH STENT PLACEMENT;  Surgeon:               Redell Lynwood Napoleon, MD;  Location: [facility];  Service:               Urology;  Laterality: Right; 06/13/2022: CYSTOSCOPY/URETEROSCOPY/HOLMIUM LASER/STENT PLACEMENT;  Right     Comment:  Procedure: CYSTOSCOPY/URETEROSCOPY/HOLMIUM LASER/STENT               PLACEMENT;  Surgeon: Francisca Redell BROCKS, MD;   Location:               [facility];  Service: Urology;  Laterality: Right; 01/04/2015: EXTRACORPOREAL SHOCK WAVE LITHOTRIPSY; Right     Comment:  Procedure: EXTRACORPOREAL SHOCK WAVE LITHOTRIPSY (ESWL);              Surgeon: Charlie JONETTA Pack, MD;  Location: [facility];                Service: Urology;  Laterality: Right; 07/08/2018: EXTRACORPOREAL SHOCK WAVE LITHOTRIPSY; N/A     Comment:  Procedure: EXTRACORPOREAL SHOCK WAVE LITHOTRIPSY (ESWL);              Surgeon: Penne Knee, MD;  Location: [facility];                Service: Urology;  Laterality: N/A; 02/21/2021: EXTRACORPOREAL SHOCK WAVE LITHOTRIPSY; Right     Comment:  Procedure: EXTRACORPOREAL SHOCK WAVE LITHOTRIPSY (ESWL);              Surgeon: Francisca Redell BROCKS, MD;  Location: [facility];                Service: Urology;  Laterality: Right; No date: TONSILLECTOMY 03/13/2016: URETEROSCOPY; Right     Comment:  Procedure: URETEROSCOPY;  Surgeon: Redell Lynwood Napoleon,               MD;  [facility]: [facility];  Service: Urology;  Laterality:               Right; 04/04/2016: URETEROSCOPY WITH HOLMIUM LASER LITHOTRIPSY; Right     Comment:  Procedure: URETEROSCOPY WITH HOLMIUM LASER LITHOTRIPSY;               Surgeon: Redell Lynwood Napoleon, MD;  Location: [facility];                Service: Urology;  Laterality: Right;     Reproductive/Obstetrics negative OB ROS                              Anesthesia Physical Anesthesia Plan  ASA: 2  Anesthesia Plan: General   Post-op Pain Management:    Induction: Intravenous  PONV Risk Score and Plan: Propofol  infusion and TIVA  Airway Management Planned: Natural Airway and Nasal Cannula  Additional Equipment:   Intra-op Plan:   Post-operative Plan:   Informed Consent: I have reviewed the patients History and Physical, chart, labs and discussed the procedure including the risks, benefits and alternatives for the proposed anesthesia with the patient or authorized  representative who has indicated his/her understanding and acceptance.     Dental Advisory Given  Plan Discussed with: Anesthesiologist, CRNA and Surgeon  Anesthesia Plan Comments: (Patient consented for risks of anesthesia including but not limited to:  - adverse reactions to medications - risk of airway placement if required - damage to eyes, teeth, lips or other oral mucosa - nerve damage due to positioning  - sore throat or hoarseness - Damage to heart, brain, nerves, lungs, other parts of body or loss of life  Patient voiced understanding and assent.)        Anesthesia Quick Evaluation

## 2023-10-09 NOTE — Anesthesia Postprocedure Evaluation (Signed)
 Anesthesia Post Note  Patient: Danny Castro  Procedure(s) Performed: COLONOSCOPY CONTROL OF HEMORRHAGE, GI TRACT, ENDOSCOPIC, BY CLIPPING OR OVERSEWING POLYPECTOMY, INTESTINE  Patient location during evaluation: Endoscopy Anesthesia Type: General Level of consciousness: awake and alert Pain management: pain level controlled Vital Signs Assessment: post-procedure vital signs reviewed and stable Respiratory status: spontaneous breathing, nonlabored ventilation and respiratory function stable Cardiovascular status: blood pressure returned to baseline and stable Postop Assessment: no apparent nausea or vomiting Anesthetic complications: no   No notable events documented.   Last Vitals:  Vitals:   10/09/23 0950 10/09/23 1000  BP: 137/85 (!) 140/94  Pulse: 63 63  Resp: 14 16  Temp:    SpO2: 100% 99%    Last Pain:  Vitals:   10/09/23 1000  PainSc: 0-No pain                 Fairy POUR Darreon Lutes

## 2023-10-09 NOTE — Op Note (Signed)
 [facility] Patient Name: Danny Castro Procedure Date: 10/09/2023 9:05 AM MRN: [id number] Account #: [id number] Date of Birth: [birth year] Admit Type: Outpatient Age: 73 Room: [facility] ENDO ROOM 4 Gender: Male Note Status: Finalized Instrument Name: Colonoscope [id number] Procedure:             Colonoscopy Indications:           Screening for colorectal malignant neoplasm, Last                         colonoscopy: April 2013 Providers:             Corinn Jess Brooklyn MD, MD Referring MD:          Oneil PHEBE Pinal, MD (Referring MD) Medicines:             General Anesthesia Complications:         No immediate complications. Estimated blood loss: None. Procedure:             Pre-Anesthesia Assessment:                        - Prior to the procedure, a History and Physical was                         performed, and patient medications and allergies were                         reviewed. The patient is competent. The risks and                         benefits of the procedure and the sedation options and                         risks were discussed with the patient. All questions                         were answered and informed consent was obtained.                         Patient identification and proposed procedure were                         verified by the physician, the nurse, the                         anesthesiologist, the anesthetist and the technician                         in the pre-procedure area in the procedure room in the                         endoscopy suite. Mental Status Examination: alert and                         oriented. Airway Examination: normal oropharyngeal                         airway and neck mobility. Respiratory Examination:  clear to auscultation. CV Examination: normal.                         Prophylactic Antibiotics: The patient does not require                         prophylactic antibiotics.  Prior Anticoagulants: The                         patient has taken no anticoagulant or antiplatelet                         agents. ASA Grade Assessment: II - A patient with mild                         systemic disease. After reviewing the risks and                         benefits, the patient was deemed in satisfactory                         condition to undergo the procedure. The anesthesia                         plan was to use general anesthesia. Immediately prior                         to administration of medications, the patient was                         re-assessed for adequacy to receive sedatives. The                         heart rate, respiratory rate, oxygen saturations,                         blood pressure, adequacy of pulmonary ventilation, and                         response to care were monitored throughout the                         procedure. The physical status of the patient was                         re-assessed after the procedure.                        After obtaining informed consent, the colonoscope was                         passed under direct vision. Throughout the procedure,                         the patient's blood pressure, pulse, and oxygen                         saturations were monitored continuously. The  Colonoscope was introduced through the anus and                         advanced to the the terminal ileum, with                         identification of the appendiceal orifice and IC                         valve. The colonoscopy was performed without                         difficulty. The patient tolerated the procedure well.                         The quality of the bowel preparation was evaluated                         using the BBPS [facility] Bowel Preparation Scale) with                         scores of: Right Colon = 3, Transverse Colon = 3 and                         Left Colon = 3 (entire mucosa seen  well with no                         residual staining, small fragments of stool or opaque                         liquid). The total BBPS score equals 9. The terminal                         ileum, ileocecal valve, appendiceal orifice, and                         rectum were photographed. Findings:      The perianal and digital rectal examinations were normal. Pertinent       negatives include normal sphincter tone and no palpable rectal lesions.      The terminal ileum appeared normal.      An 8 mm polyp was found in the proximal transverse colon. The polyp was       sessile. The polyp was removed with a cold snare. Resection and       retrieval were complete. To prevent bleeding after the polypectomy, one       hemostatic clip was successfully placed (MR safe). Clip manufacturer:       [employer]. There was no bleeding at the end of the procedure.      A 3 mm polyp was found in the rectum. The polyp was sessile. The polyp       was removed with a cold snare. Resection and retrieval were complete.       Estimated blood loss: none.      The retroflexed view of the distal rectum and anal verge was normal and       showed no anal or rectal abnormalities. Impression:            -  The examined portion of the ileum was normal.                        - One 8 mm polyp in the proximal transverse colon,                         removed with a cold snare. Resected and retrieved.                         Clip manufacturer: [employer]. Clip (MR safe)                         was placed.                        - One 3 mm polyp in the rectum, removed with a cold                         snare. Resected and retrieved.                        - The distal rectum and anal verge are normal on                         retroflexion view. Recommendation:        - Discharge patient to home (with escort).                        - Resume previous diet today.                        - Continue present  medications.                        - Await pathology results.                        - Repeat colonoscopy in 5 years for surveillance. Procedure Code(s):     --- Professional ---                        [phone], Colonoscopy, flexible; with removal of                         tumor(s), polyp(s), or other lesion(s) by snare                         technique Diagnosis Code(s):     --- Professional ---                        Z12.11, Encounter for screening for malignant neoplasm                         of colon                        D12.3, Benign neoplasm of transverse colon (hepatic                         flexure or splenic flexure)  D12.8, Benign neoplasm of rectum CPT copyright 2022 American Medical Association. All rights reserved. The codes documented in this report are preliminary and upon coder review may  be revised to meet current compliance requirements. Dr. Corinn Brooklyn Corinn Jess Brooklyn MD, MD 10/09/2023 9:39:03 AM This report has been signed electronically. Number of Addenda: 0 Note Initiated On: 10/09/2023 9:05 AM Scope Withdrawal Time: 0 hours 17 minutes 44 seconds  Total Procedure Duration: 0 hours 19 minutes 54 seconds  Estimated Blood Loss:  Estimated blood loss: none.      [facility]

## 2023-10-09 NOTE — Transfer of Care (Signed)
 Immediate Anesthesia Transfer of Care Note  Patient: Danny Castro  Procedure(s) Performed: COLONOSCOPY CONTROL OF HEMORRHAGE, GI TRACT, ENDOSCOPIC, BY CLIPPING OR OVERSEWING POLYPECTOMY, INTESTINE  Patient Location: Endoscopy Unit  Anesthesia Type:General  Level of Consciousness: drowsy  Airway & Oxygen Therapy: Patient Spontanous Breathing  Post-op Assessment: Report given to RN and Post -op Vital signs reviewed and stable  Post vital signs: Reviewed and stable  Last Vitals:  Vitals Value Taken Time  BP 158/76 0940  Temp 35.8 0940  Pulse 77 0940  Resp 16 0940  SpO2 98 0940    Last Pain:  Vitals:   10/09/23 0940  PainSc: Asleep         Complications: No notable events documented.

## 2023-10-12 LAB — SURGICAL PATHOLOGY

## 2023-10-14 ENCOUNTER — Ambulatory Visit: Payer: Self-pay | Admitting: Gastroenterology

## 2023-10-14 NOTE — Progress Notes (Signed)
 Recommend surveillance colonoscopy in 5 years  RV

## 2023-10-22 ENCOUNTER — Encounter (INDEPENDENT_AMBULATORY_CARE_PROVIDER_SITE_OTHER): Admitting: Nurse Practitioner

## 2023-10-22 ENCOUNTER — Encounter (INDEPENDENT_AMBULATORY_CARE_PROVIDER_SITE_OTHER)

## 2023-10-29 ENCOUNTER — Encounter
Admission: RE | Disposition: A | Discharge status: Home / Self Care | Payer: Self-pay | Source: Home / Self Care | Attending: Vascular Surgery

## 2023-10-29 ENCOUNTER — Encounter: Payer: Self-pay | Admitting: Vascular Surgery

## 2023-10-29 ENCOUNTER — Ambulatory Visit
Admission: RE | Admit: 2023-10-29 | Discharge: 2023-10-29 | Disposition: A | Discharge status: Home / Self Care | Attending: Vascular Surgery | Admitting: Vascular Surgery

## 2023-10-29 ENCOUNTER — Other Ambulatory Visit: Payer: Self-pay

## 2023-10-29 DIAGNOSIS — I743 Embolism and thrombosis of arteries of the lower extremities: Secondary | ICD-10-CM | POA: Diagnosis not present

## 2023-10-29 DIAGNOSIS — I70219 Atherosclerosis of native arteries of extremities with intermittent claudication, unspecified extremity: Secondary | ICD-10-CM | POA: Diagnosis present

## 2023-10-29 DIAGNOSIS — I70213 Atherosclerosis of native arteries of extremities with intermittent claudication, bilateral legs: Secondary | ICD-10-CM | POA: Insufficient documentation

## 2023-10-29 DIAGNOSIS — Z87891 Personal history of nicotine dependence: Secondary | ICD-10-CM | POA: Diagnosis not present

## 2023-10-29 DIAGNOSIS — M170 Bilateral primary osteoarthritis of knee: Secondary | ICD-10-CM | POA: Insufficient documentation

## 2023-10-29 HISTORY — PX: LOWER EXTREMITY ANGIOGRAPHY: CATH118251

## 2023-10-29 LAB — BUN: BUN: 26 mg/dL — ABNORMAL HIGH (ref 8–23)

## 2023-10-29 LAB — CREATININE, SERUM
Creatinine, Ser: 0.92 mg/dL (ref 0.61–1.24)
GFR, Estimated: >60 mL/min (ref >60)

## 2023-10-29 SURGERY — LOWER EXTREMITY ANGIOGRAPHY
Anesthesia: Moderate Sedation | Site: Leg Lower | Laterality: Right

## 2023-10-29 MED ORDER — MIDAZOLAM HCL 2 MG/2ML IJ SOLN
INTRAMUSCULAR | Status: DC | PRN
  Administered 2023-10-29: 1 mg via INTRAVENOUS
  Administered 2023-10-29: 2 mg via INTRAVENOUS
  Administered 2023-10-29 (×2): .5 mg via INTRAVENOUS
  Administered 2023-10-29: 1 mg via INTRAVENOUS

## 2023-10-29 MED ORDER — ONDANSETRON HCL 4 MG/2ML IJ SOLN: 4.0000 mg | Freq: Four times a day (QID) | INTRAMUSCULAR | Status: DC | PRN

## 2023-10-29 MED ORDER — MIDAZOLAM HCL 2 MG/ML PO SYRP: 8.0000 mg | ORAL_SOLUTION | Freq: Once | ORAL | Status: DC | PRN

## 2023-10-29 MED ORDER — FENTANYL CITRATE (PF) 100 MCG/2ML IJ SOLN
INTRAMUSCULAR | Status: AC
  Filled 2023-10-29: qty 2

## 2023-10-29 MED ORDER — ATORVASTATIN CALCIUM 10 MG PO TABS
10.0000 mg | ORAL_TABLET | Freq: Every day | ORAL | 11 refills | Status: AC
Start: 2023-10-29 — End: 2024-10-28

## 2023-10-29 MED ORDER — MIDAZOLAM HCL 5 MG/5ML IJ SOLN
INTRAMUSCULAR | Status: AC
  Filled 2023-10-29: qty 5

## 2023-10-29 MED ORDER — FAMOTIDINE 20 MG PO TABS: 40.0000 mg | ORAL_TABLET | Freq: Once | ORAL | Status: DC | PRN

## 2023-10-29 MED ORDER — HEPARIN SODIUM (PORCINE) 1000 UNIT/ML IJ SOLN
INTRAMUSCULAR | Status: AC
  Filled 2023-10-29: qty 10

## 2023-10-29 MED ORDER — SODIUM CHLORIDE 0.9 % IV SOLN: 250.0000 mL | INTRAVENOUS | Status: DC | PRN

## 2023-10-29 MED ORDER — METHYLPREDNISOLONE SODIUM SUCC 125 MG IJ SOLR: 125.0000 mg | Freq: Once | INTRAMUSCULAR | Status: DC | PRN

## 2023-10-29 MED ORDER — ASPIRIN 81 MG PO TBEC: 81.0000 mg | DELAYED_RELEASE_TABLET | Freq: Every day | ORAL | Status: DC

## 2023-10-29 MED ORDER — CLOPIDOGREL BISULFATE 75 MG PO TABS: 75.0000 mg | ORAL_TABLET | Freq: Every day | ORAL | 11 refills | Status: AC

## 2023-10-29 MED ORDER — DIPHENHYDRAMINE HCL 50 MG/ML IJ SOLN: 50.0000 mg | Freq: Once | INTRAMUSCULAR | Status: DC | PRN

## 2023-10-29 MED ORDER — HYDRALAZINE HCL 20 MG/ML IJ SOLN: 5.0000 mg | INTRAMUSCULAR | Status: DC | PRN

## 2023-10-29 MED ORDER — ASPIRIN 81 MG PO TBEC
81.0000 mg | DELAYED_RELEASE_TABLET | Freq: Every day | ORAL | 2 refills | Status: AC
Start: 2023-10-29 — End: 2024-10-28

## 2023-10-29 MED ORDER — FENTANYL CITRATE (PF) 100 MCG/2ML IJ SOLN
INTRAMUSCULAR | Status: DC | PRN
  Administered 2023-10-29 (×3): 25 ug via INTRAVENOUS
  Administered 2023-10-29: 50 ug via INTRAVENOUS
  Administered 2023-10-29 (×2): 25 ug via INTRAVENOUS

## 2023-10-29 MED ORDER — LIDOCAINE-EPINEPHRINE (PF) 1 %-1:200000 IJ SOLN
INTRAMUSCULAR | Status: DC | PRN
  Administered 2023-10-29: 10 mL

## 2023-10-29 MED ORDER — HYDROMORPHONE HCL 1 MG/ML IJ SOLN: 1.0000 mg | Freq: Once | INTRAMUSCULAR | Status: DC | PRN

## 2023-10-29 MED ORDER — HEPARIN (PORCINE) IN NACL 2000-0.9 UNIT/L-% IV SOLN
INTRAVENOUS | Status: DC | PRN
  Administered 2023-10-29: 1000 mL

## 2023-10-29 MED ORDER — HEPARIN SODIUM (PORCINE) 1000 UNIT/ML IJ SOLN
INTRAMUSCULAR | Status: DC | PRN
Start: 2023-10-29 — End: 2023-10-29
  Administered 2023-10-29: 5000 [IU] via INTRAVENOUS

## 2023-10-29 MED ORDER — CLOPIDOGREL BISULFATE 75 MG PO TABS
ORAL_TABLET | ORAL | Status: AC
Start: 2023-10-29 — End: 2023-10-29
  Administered 2023-10-29: 150 mg via ORAL
  Filled 2023-10-29: qty 2

## 2023-10-29 MED ORDER — ATORVASTATIN CALCIUM 10 MG PO TABS
10.0000 mg | ORAL_TABLET | Freq: Every day | ORAL | Status: DC
  Administered 2023-10-29: 10 mg via ORAL
  Filled 2023-10-29: qty 1

## 2023-10-29 MED ORDER — IODIXANOL 320 MG/ML IV SOLN
INTRAVENOUS | Status: DC | PRN
Start: 2023-10-29 — End: 2023-10-29
  Administered 2023-10-29: 65 mL

## 2023-10-29 MED ORDER — HEPARIN (PORCINE) IN NACL 1000-0.9 UT/500ML-% IV SOLN
INTRAVENOUS | Status: DC | PRN
Start: 2023-10-29 — End: 2023-10-29
  Administered 2023-10-29: 1000 mL

## 2023-10-29 MED ORDER — LABETALOL HCL 5 MG/ML IV SOLN: 10.0000 mg | INTRAVENOUS | Status: DC | PRN

## 2023-10-29 MED ORDER — CLOPIDOGREL BISULFATE 75 MG PO TABS: 75.0000 mg | ORAL_TABLET | Freq: Every day | ORAL | Status: DC

## 2023-10-29 MED ORDER — ACETAMINOPHEN 325 MG PO TABS: 650.0000 mg | ORAL_TABLET | ORAL | Status: DC | PRN

## 2023-10-29 MED ORDER — CLOPIDOGREL BISULFATE 75 MG PO TABS: 150.0000 mg | ORAL_TABLET | Freq: Once | ORAL | Status: AC

## 2023-10-29 MED ORDER — SODIUM CHLORIDE 0.9% FLUSH: 3.0000 mL | Freq: Two times a day (BID) | INTRAVENOUS | Status: DC

## 2023-10-29 MED ORDER — ASPIRIN 81 MG PO TBEC
DELAYED_RELEASE_TABLET | ORAL | Status: AC
Start: 2023-10-29 — End: 2023-10-29
  Administered 2023-10-29: 81 mg via ORAL
  Filled 2023-10-29: qty 1

## 2023-10-29 MED ORDER — CEFAZOLIN SODIUM-DEXTROSE 2-4 GM/100ML-% IV SOLN
2.0000 g | INTRAVENOUS | Status: AC
  Administered 2023-10-29: 2 g via INTRAVENOUS

## 2023-10-29 MED ORDER — SODIUM CHLORIDE 0.9% FLUSH
3.0000 mL | INTRAVENOUS | Status: DC | PRN
Start: 2023-10-29 — End: 2023-10-29

## 2023-10-29 MED ORDER — SODIUM CHLORIDE 0.9 % IV SOLN: INTRAVENOUS | Status: DC

## 2023-10-29 MED ORDER — CEFAZOLIN SODIUM-DEXTROSE 2-4 GM/100ML-% IV SOLN
INTRAVENOUS | Status: AC
  Filled 2023-10-29: qty 100

## 2023-10-29 SURGICAL SUPPLY — 21 items
BALLOON LUTONIX 018 5X300X130 (BALLOONS) IMPLANT
BALLOON ULTRVRSE 3X100X150 (BALLOONS) IMPLANT
CATH ANGIO 5F PIGTAIL 65CM (CATHETERS) IMPLANT
CATH BEACON 5 .038 100 VERT TP (CATHETERS) IMPLANT
CATH CXI SUPP ANG 4FR 135 (CATHETERS) IMPLANT
CATH ROTAREX 135 6FR (CATHETERS) IMPLANT
COVER PROBE ULTRASOUND 5X96 (MISCELLANEOUS) IMPLANT
DEVICE PRESTO INFLATION (MISCELLANEOUS) IMPLANT
DEVICE VASC CLSR CELT ART 6 (Vascular Products) IMPLANT
GLIDEWIRE ADV .035X260CM (WIRE) IMPLANT
GUIDEWIRE PFTE-COATED .018X300 (WIRE) IMPLANT
PACK ANGIOGRAPHY (CUSTOM PROCEDURE TRAY) ×1 IMPLANT
SHEATH ANL2 6FRX45 HC (SHEATH) IMPLANT
SHEATH BRITE TIP 6FRX11 (SHEATH) IMPLANT
STENT VIABAHN 6X100X120 (Permanent Stent) IMPLANT
STENT VIABAHN 6X250X120 (Permanent Stent) IMPLANT
SYR MEDRAD MARK 7 150ML (SYRINGE) IMPLANT
TUBING CONTRAST HIGH PRESS 72 (TUBING) IMPLANT
WIRE COMMAND ST ANG 014 300 (WIRE) IMPLANT
WIRE G V18X300CM (WIRE) IMPLANT
WIRE J 3MM .035X145CM (WIRE) IMPLANT

## 2023-10-29 NOTE — Interval H&P Note (Signed)
 History and Physical Interval Note:  10/29/2023 11:02 AM  Danny Castro  has presented today for surgery, with the diagnosis of RLE Angio   ASO w claudication.  The various methods of treatment have been discussed with the patient and family. After consideration of risks, benefits and other options for treatment, the patient has consented to  Procedure(s): Lower Extremity Angiography (Right) as a surgical intervention.  The patient's history has been reviewed, patient examined, no change in status, stable for surgery.  I have reviewed the patient's chart and labs.  Questions were answered to the patient's satisfaction.     Adrianne Shackleton

## 2023-10-29 NOTE — Op Note (Signed)
 Danny Castro  Percutaneous Study/Intervention Procedural Note   Date of Surgery: 10/29/2023  Surgeon(s):Danny Castro    Assistants:none  Pre-operative Diagnosis: PAD with claudication bilateral lower extremities  Post-operative diagnosis:  Same  Procedure(s) Performed:             1.  Ultrasound guidance for vascular access left femoral artery             2.  Catheter placement into right common femoral artery from left femoral             3.  Aortogram and selective right lower extremity angiogram             4.  Mechanical thrombectomy of the right SFA and popliteal arteries with the Rota Rex device             5.  Angioplasty of the right SFA and popliteal arteries with 5 mm diameter Lutonix drug-coated angioplasty balloons  6.  Angioplasty of the right tibioperoneal trunk and proximal peroneal artery with 3 mm diameter angioplasty balloon  7.  Stent placement to the right SFA and popliteal arteries with 6 mm diameter by 25 cm length Viabahn stent and 6 mm diameter by 10 cm length Viabahn stent             8.  Celt closure device left femoral artery  EBL: 100 cc  Contrast: 65 cc  Fluoro Time: 13.1 minutes  Moderate Conscious Sedation Time: approximately 70 minutes using 5 mg of Versed  and 175 mcg of Fentanyl               Indications:  Patient is a 73 y.o.male with disabling claudication symptoms of both lower extremities. The patient has noninvasive study showing an occluded right SFA with chronic thrombus and a highly stenotic left SFA.  ABIs were markedly reduced bilaterally. The patient is brought in for angiography for further evaluation and potential treatment. Risks and benefits are discussed and informed consent is obtained.   Procedure:  The patient was identified and appropriate procedural time out was performed.  The patient was then placed supine on the table and prepped and draped in the usual sterile fashion. Moderate conscious sedation was  administered during a face to face encounter with the patient throughout the procedure with my supervision of the RN administering medicines and monitoring the patient's vital signs, pulse oximetry, telemetry and mental status throughout from the start of the procedure until the patient was taken to the recovery room. Ultrasound was used to evaluate the left common femoral artery.  It was patent .  A digital ultrasound image was acquired.  A Seldinger needle was used to access the left common femoral artery under direct ultrasound guidance and a permanent image was performed.  A 0.035 J wire was advanced without resistance and a 5Fr sheath was placed.  Pigtail catheter was placed into the aorta and an AP aortogram was performed. This demonstrated normal renal arteries and normal aorta and iliac segments without significant stenosis. I then crossed the aortic bifurcation and advanced to the right femoral head. Selective right lower extremity angiogram was then performed. This demonstrated the expected chronic occlusion of the right SFA with chronic thrombus with reconstitution of the most proximal popliteal artery at Hunter's canal.  The vessel normalized over several centimeters then in the mid popliteal artery there was a high-grade stenosis greater than 90%.  The below-knee popliteal artery normalized.  There was a stenosis in the tibioperoneal trunk and  the moderate 60 to 65% range near its origin.  This was at the takeoff of the anterior tibial artery.  The peroneal artery was large and was the only runoff distally.  The posterior tibial artery was chronically occluded and was not seen.  The anterior tibial artery was patent proximally but then occluded after being heavily diseased in the proximal and mid segments and did not reconstitute distally. It was felt that it was in the patient's best interest to proceed with intervention after these images to avoid a second procedure and a larger amount of contrast and  fluoroscopy based off of the findings from the initial angiogram. The patient was systemically heparinized and a 6 [place] Ansell sheath was then placed over the [employer] wire. I then used a Kumpe catheter and the advantage wire to get down into the SFA and popliteal occlusion.  The occlusion was able to be crossed with the advantage wire and a Kumpe catheter, but the high-grade stenosis in the popliteal artery required exchanging for a CXI catheter and ultimately a command 14 wire.  Intraluminal flow was confirmed in the tibioperoneal trunk.  I then replaced a V18 wire.  Mechanical thrombectomy was performed to debulk the chronic thrombus in the right SFA and popliteal arteries.  2 passes were made with the [place] Rex device pulling out a large amount of thrombus from the right SFA and popliteal arteries.  There remained significant disease throughout and I performed angioplasty of the right SFA and popliteal arteries with a 5 mm diameter by 30 cm length Lutonix drug-coated angioplasty balloon inflated to 8 atm for 1 minute.  A 3 mm diameter by 10 cm length angioplasty balloons inflated in the tibioperoneal trunk and proximal peroneal artery and taken up to 10 atm for 1 minute.  Completion imaging showed about a 30% residual stenosis in the tibioperoneal trunk.  The SFA and above-knee popliteal artery still had multiple areas of greater than 50% disease that I elected to stent.  A 6 mm diameter by 25 cm length Viabahn stent and a 6 mm diameter by 10 cm length Viabahn stent were then selected and deployed from the mid right popliteal artery up to the proximal to mid right SFA.  These were postdilated with 5 mm diameter Lutonix drug-coated balloon with excellent angiographic completion result and less than 10% residual stenosis. I elected to terminate the procedure. The sheath was removed and Celt closure device was deployed in the left femoral artery with excellent hemostatic result. The patient was taken to  the recovery room in stable condition having tolerated the procedure well.  Findings:               Aortogram:  This demonstrated normal renal arteries and normal aorta and iliac segments without significant stenosis.             Right lower Extremity:  This demonstrated the expected chronic occlusion of the right SFA with chronic thrombus with reconstitution of the most proximal popliteal artery at Hunter's canal.  The vessel normalized over several centimeters then in the mid popliteal artery there was a high-grade stenosis greater than 90%.  The below-knee popliteal artery normalized.  There was a stenosis in the tibioperoneal trunk and the moderate 60 to 65% range near its origin.  This was at the takeoff of the anterior tibial artery.  The peroneal artery was large and was the only runoff distally.  The posterior tibial artery was chronically occluded and was not seen.  The anterior tibial artery was patent proximally but then occluded after being heavily diseased in the proximal and mid segments and did not reconstitute distally   Disposition: Patient was taken to the recovery room in stable condition having tolerated the procedure well.  Complications: None  Selinda Gu 10/29/2023 1:50 PM   This note was created with Dragon Medical transcription system. Any errors in dictation are purely unintentional.

## 2023-11-02 ENCOUNTER — Telehealth (INDEPENDENT_AMBULATORY_CARE_PROVIDER_SITE_OTHER): Payer: Self-pay

## 2023-11-02 NOTE — Telephone Encounter (Signed)
 It is not uncommon after an angiogram to have some pain in the leg especially with significant intervention as Danny Castro had.  However there is some concern with him not being able to walk or sleep without significant pain.  Lets even get him in for an arterial duplex only to ensure his stents are patent and there is no other issues.

## 2023-11-02 NOTE — Telephone Encounter (Signed)
 Patient spouse left a message stating that her husband has been experience right leg pain from the thigh to the back of the knee since Thursday. As he walks the pain increase and he is not able to sleep well. The patient had right le angio on 10/29/23. Please Advise

## 2023-11-03 ENCOUNTER — Other Ambulatory Visit (INDEPENDENT_AMBULATORY_CARE_PROVIDER_SITE_OTHER): Payer: Self-pay | Admitting: Nurse Practitioner

## 2023-11-03 ENCOUNTER — Ambulatory Visit (INDEPENDENT_AMBULATORY_CARE_PROVIDER_SITE_OTHER)

## 2023-11-03 DIAGNOSIS — I739 Peripheral vascular disease, unspecified: Secondary | ICD-10-CM | POA: Diagnosis not present

## 2023-11-05 ENCOUNTER — Ambulatory Visit
Admission: RE | Admit: 2023-11-05 | Discharge: 2023-11-05 | Disposition: A | Discharge status: Home / Self Care | Attending: Vascular Surgery | Admitting: Vascular Surgery

## 2023-11-05 ENCOUNTER — Encounter
Admission: RE | Disposition: A | Discharge status: Home / Self Care | Payer: Self-pay | Source: Home / Self Care | Attending: Vascular Surgery

## 2023-11-05 ENCOUNTER — Other Ambulatory Visit: Payer: Self-pay

## 2023-11-05 ENCOUNTER — Encounter: Payer: Self-pay | Admitting: Vascular Surgery

## 2023-11-05 DIAGNOSIS — I70219 Atherosclerosis of native arteries of extremities with intermittent claudication, unspecified extremity: Secondary | ICD-10-CM

## 2023-11-05 DIAGNOSIS — I70213 Atherosclerosis of native arteries of extremities with intermittent claudication, bilateral legs: Secondary | ICD-10-CM | POA: Insufficient documentation

## 2023-11-05 DIAGNOSIS — Z79899 Other long term (current) drug therapy: Secondary | ICD-10-CM | POA: Insufficient documentation

## 2023-11-05 DIAGNOSIS — M170 Bilateral primary osteoarthritis of knee: Secondary | ICD-10-CM | POA: Insufficient documentation

## 2023-11-05 DIAGNOSIS — I70212 Atherosclerosis of native arteries of extremities with intermittent claudication, left leg: Secondary | ICD-10-CM

## 2023-11-05 DIAGNOSIS — Z87891 Personal history of nicotine dependence: Secondary | ICD-10-CM | POA: Diagnosis not present

## 2023-11-05 DIAGNOSIS — I743 Embolism and thrombosis of arteries of the lower extremities: Secondary | ICD-10-CM | POA: Diagnosis not present

## 2023-11-05 HISTORY — PX: LOWER EXTREMITY ANGIOGRAPHY: CATH118251

## 2023-11-05 LAB — CREATININE, SERUM
Creatinine, Ser: 0.98 mg/dL (ref 0.61–1.24)
GFR, Estimated: >60 mL/min (ref >60)

## 2023-11-05 LAB — BUN: BUN: 23 mg/dL (ref 8–23)

## 2023-11-05 SURGERY — LOWER EXTREMITY ANGIOGRAPHY
Anesthesia: Moderate Sedation | Site: Leg Lower | Laterality: Left

## 2023-11-05 MED ORDER — METHYLPREDNISOLONE SODIUM SUCC 125 MG IJ SOLR: 125.0000 mg | Freq: Once | INTRAMUSCULAR | Status: DC | PRN

## 2023-11-05 MED ORDER — SODIUM CHLORIDE 0.9% FLUSH: 3.0000 mL | INTRAVENOUS | Status: DC | PRN

## 2023-11-05 MED ORDER — HYDROMORPHONE HCL 1 MG/ML IJ SOLN: 1.0000 mg | Freq: Once | INTRAMUSCULAR | Status: DC | PRN

## 2023-11-05 MED ORDER — HYDRALAZINE HCL 20 MG/ML IJ SOLN: 5.0000 mg | INTRAMUSCULAR | Status: DC | PRN

## 2023-11-05 MED ORDER — ACETAMINOPHEN 325 MG PO TABS: 650.0000 mg | ORAL_TABLET | ORAL | Status: DC | PRN

## 2023-11-05 MED ORDER — DIPHENHYDRAMINE HCL 50 MG/ML IJ SOLN: 50.0000 mg | Freq: Once | INTRAMUSCULAR | Status: DC | PRN

## 2023-11-05 MED ORDER — SODIUM CHLORIDE 0.9 % IV SOLN: INTRAVENOUS | Status: DC

## 2023-11-05 MED ORDER — HEPARIN SODIUM (PORCINE) 1000 UNIT/ML IJ SOLN
INTRAMUSCULAR | Status: AC
  Filled 2023-11-05: qty 10

## 2023-11-05 MED ORDER — FENTANYL CITRATE (PF) 100 MCG/2ML IJ SOLN
INTRAMUSCULAR | Status: DC | PRN
Start: 2023-11-05 — End: 2023-11-05
  Administered 2023-11-05 (×3): 50 ug via INTRAVENOUS

## 2023-11-05 MED ORDER — IODIXANOL 320 MG/ML IV SOLN
INTRAVENOUS | Status: DC | PRN
  Administered 2023-11-05: 50 mL

## 2023-11-05 MED ORDER — FAMOTIDINE 20 MG PO TABS: 40.0000 mg | ORAL_TABLET | Freq: Once | ORAL | Status: DC | PRN

## 2023-11-05 MED ORDER — MIDAZOLAM HCL 2 MG/2ML IJ SOLN
INTRAMUSCULAR | Status: AC
  Filled 2023-11-05: qty 4

## 2023-11-05 MED ORDER — CEFAZOLIN SODIUM-DEXTROSE 2-4 GM/100ML-% IV SOLN
INTRAVENOUS | Status: AC
  Filled 2023-11-05: qty 100

## 2023-11-05 MED ORDER — CEFAZOLIN SODIUM-DEXTROSE 2-4 GM/100ML-% IV SOLN
2.0000 g | INTRAVENOUS | Status: AC
  Administered 2023-11-05: 2 g via INTRAVENOUS

## 2023-11-05 MED ORDER — SODIUM CHLORIDE 0.9% FLUSH: 3.0000 mL | Freq: Two times a day (BID) | INTRAVENOUS | Status: DC

## 2023-11-05 MED ORDER — MIDAZOLAM HCL 2 MG/2ML IJ SOLN
INTRAMUSCULAR | Status: AC
  Filled 2023-11-05: qty 2

## 2023-11-05 MED ORDER — SODIUM CHLORIDE 0.9 % IV SOLN
250.0000 mL | INTRAVENOUS | Status: DC | PRN
Start: 2023-11-05 — End: 2023-11-05

## 2023-11-05 MED ORDER — FENTANYL CITRATE (PF) 100 MCG/2ML IJ SOLN
INTRAMUSCULAR | Status: AC
  Filled 2023-11-05: qty 2

## 2023-11-05 MED ORDER — LIDOCAINE-EPINEPHRINE (PF) 1 %-1:200000 IJ SOLN
INTRAMUSCULAR | Status: DC | PRN
Start: 2023-11-05 — End: 2023-11-05
  Administered 2023-11-05: 10 mL

## 2023-11-05 MED ORDER — HEPARIN SODIUM (PORCINE) 1000 UNIT/ML IJ SOLN
INTRAMUSCULAR | Status: DC | PRN
  Administered 2023-11-05: 5000 [IU] via INTRAVENOUS

## 2023-11-05 MED ORDER — ONDANSETRON HCL 4 MG/2ML IJ SOLN: 4.0000 mg | Freq: Four times a day (QID) | INTRAMUSCULAR | Status: DC | PRN

## 2023-11-05 MED ORDER — MIDAZOLAM HCL 2 MG/ML PO SYRP: 8.0000 mg | ORAL_SOLUTION | Freq: Once | ORAL | Status: DC | PRN

## 2023-11-05 MED ORDER — MIDAZOLAM HCL 2 MG/2ML IJ SOLN
INTRAMUSCULAR | Status: DC | PRN
  Administered 2023-11-05: 2 mg via INTRAVENOUS
  Administered 2023-11-05 (×2): 1 mg via INTRAVENOUS

## 2023-11-05 MED ORDER — LABETALOL HCL 5 MG/ML IV SOLN: 10.0000 mg | INTRAVENOUS | Status: DC | PRN

## 2023-11-05 MED ORDER — HEPARIN (PORCINE) IN NACL 1000-0.9 UT/500ML-% IV SOLN
INTRAVENOUS | Status: DC | PRN
  Administered 2023-11-05: 1000 mL

## 2023-11-05 SURGICAL SUPPLY — 18 items
BALLOON LUTONIX 018 5X150X130 (BALLOONS) IMPLANT
CATH ANGIO 5F PIGTAIL 65CM (CATHETERS) IMPLANT
CATH BEACON 5 .038 100 VERT TP (CATHETERS) IMPLANT
CATH ROTAREX 135 6FR (CATHETERS) IMPLANT
COVER PROBE ULTRASOUND 5X96 (MISCELLANEOUS) IMPLANT
DEVICE PRESTO INFLATION (MISCELLANEOUS) IMPLANT
DEVICE VASC CLSR CELT ART 6 (Vascular Products) IMPLANT
GLIDEWIRE ADV .035X260CM (WIRE) IMPLANT
GUIDEWIRE PFTE-COATED .018X300 (WIRE) IMPLANT
PACK ANGIOGRAPHY (CUSTOM PROCEDURE TRAY) ×1 IMPLANT
SHEATH ANL2 6FRX45 HC (SHEATH) IMPLANT
SHEATH BRITE TIP 5FRX11 (SHEATH) IMPLANT
SHEATH BRITE TIP 6FRX11 (SHEATH) IMPLANT
STENT VIABAHN 6X100X120 (Permanent Stent) IMPLANT
SYR MEDRAD MARK 7 150ML (SYRINGE) IMPLANT
TUBING CONTRAST HIGH PRESS 72 (TUBING) IMPLANT
WIRE G V18X300CM (WIRE) IMPLANT
WIRE J 3MM .035X145CM (WIRE) IMPLANT

## 2023-11-05 NOTE — Op Note (Signed)
 [facility]  Percutaneous Study/Intervention Procedural Note   Date of Surgery: 11/05/2023  Surgeon(s):Vester Titsworth    Assistants:none  Pre-operative Diagnosis: PAD with claudication left lower extremity  Post-operative diagnosis:  Same  Procedure(s) Performed:             1.  Ultrasound guidance for vascular access right femoral artery             2.  Catheter placement into left common femoral artery from right femoral approach             3.  Aortogram and selective left lower extremity angiogram             4.  Mechanical thrombectomy of the left SFA with the Rota Rex device             5.  Stent placement to the left SFA with 6 mm diameter by 10 cm length Viabahn stent  6.  Celt closure device right femoral artery  EBL: 25 cc  Contrast: 50 cc  Fluoro Time: 6.3 minutes  Moderate Conscious Sedation Time: approximately 42 minutes using 4 mg of Versed  and 150 mcg of Fentanyl               Indications:  Patient is a 73 y.o.male with disabling claudication symptoms of the lower extremity status post right lower extremity revascularization last week. The patient has noninvasive study showing bilateral SFA stenosis or occlusion with reduced ABIs. The patient is brought in for angiography for further evaluation and potential treatment.  Risks and benefits are discussed and informed consent is obtained.   Procedure:  The patient was identified and appropriate procedural time out was performed.  The patient was then placed supine on the table and prepped and draped in the usual sterile fashion. Moderate conscious sedation was administered during a face to face encounter with the patient throughout the procedure with my supervision of the RN administering medicines and monitoring the patient's vital signs, pulse oximetry, telemetry and mental status throughout from the start of the procedure until the patient was taken to the recovery room. Ultrasound was used to evaluate  the right common femoral artery.  It was patent .  A digital ultrasound image was acquired.  A Seldinger needle was used to access the right common femoral artery under direct ultrasound guidance and a permanent image was performed.  A 0.035 J wire was advanced without resistance and a 5Fr sheath was placed.  Pigtail catheter was placed into the aorta and an AP aortogram was performed. This demonstrated normal renal arteries and normal aorta and iliac segments without significant stenosis. I then crossed the aortic bifurcation and advanced to the left femoral head. Selective left lower extremity angiogram was then performed. This demonstrated fairly normal left common femoral artery, profunda femoris artery, and proximal SFA.  In the mid SFA, there was a short segment total occlusion with marked collaterals.  This is over about a 5 to 8 cm range.  There was reconstitution of the distal SFA.  The popliteal artery was fairly normal.  There was then two-vessel runoff distally with the anterior tibial and peroneal arteries.  The posterior tibial artery was chronically occluded without distal reconstitution. It was felt that it was in the patient's best interest to proceed with intervention after these images to avoid a second procedure and a larger amount of contrast and fluoroscopy based off of the findings from the initial angiogram. The patient was systemically heparinized and a  6 [place] Ansell sheath was then placed over the [employer] wire. I then used a Kumpe catheter and the 0.018 advantage wire to cross the SFA occlusion with minimal difficulty.  I then perform mechanical thrombectomy to debulk the chronic thrombus in the left SFA with the [place] Rex device.  3 passes were made to reduce the thrombus burden and imaging showed a channel now present but significant residual stenosis and a small amount of residual chronic thrombus.  I elected to primarily stent this lesion with a covered stent.  A 6 mm diameter  by 10 cm length Viabahn stent was selected and deployed and then postdilated with a 5 mm diameter Lutonix drug-coated balloon with excellent angiographic completion result and less than 10% residual stenosis. I elected to terminate the procedure. The sheath was removed and Celt closure device was deployed in the right femoral artery with excellent hemostatic result. The patient was taken to the recovery room in stable condition having tolerated the procedure well.  Findings:               Aortogram:  This demonstrated normal renal arteries and normal aorta and iliac segments without significant stenosis.             Left Lower Extremity:  This demonstrated fairly normal left common femoral artery, profunda femoris artery, and proximal SFA.  In the mid SFA, there was a short segment total occlusion with marked collaterals.  This is over about a 5 to 8 cm range.  There was reconstitution of the distal SFA.  The popliteal artery was fairly normal.  There was then two-vessel runoff distally with the anterior tibial and peroneal arteries.  The posterior tibial artery was chronically occluded without distal reconstitution   Disposition: Patient was taken to the recovery room in stable condition having tolerated the procedure well.  Complications: None  Selinda Gu 11/05/2023 1:02 PM   This note was created with Dragon Medical transcription system. Any errors in dictation are purely unintentional.

## 2023-11-05 NOTE — Interval H&P Note (Signed)
 History and Physical Interval Note:  11/05/2023 11:02 AM  Danny Castro  has presented today for surgery, with the diagnosis of LLE Angio   ASO w claudication.  The various methods of treatment have been discussed with the patient and family. After consideration of risks, benefits and other options for treatment, the patient has consented to  Procedure(s): Lower Extremity Angiography (Left) as a surgical intervention.  The patient's history has been reviewed, patient examined, no change in status, stable for surgery.  I have reviewed the patient's chart and labs.  Questions were answered to the patient's satisfaction.     Kia Varnadore

## 2023-11-05 NOTE — Discharge Instructions (Signed)

## 2023-11-23 ENCOUNTER — Other Ambulatory Visit (INDEPENDENT_AMBULATORY_CARE_PROVIDER_SITE_OTHER): Payer: Self-pay | Admitting: Vascular Surgery

## 2023-11-23 DIAGNOSIS — Z9889 Other specified postprocedural states: Secondary | ICD-10-CM

## 2023-11-24 ENCOUNTER — Ambulatory Visit (INDEPENDENT_AMBULATORY_CARE_PROVIDER_SITE_OTHER)

## 2023-11-24 ENCOUNTER — Encounter (INDEPENDENT_AMBULATORY_CARE_PROVIDER_SITE_OTHER): Payer: Self-pay | Admitting: Nurse Practitioner

## 2023-11-24 ENCOUNTER — Ambulatory Visit (INDEPENDENT_AMBULATORY_CARE_PROVIDER_SITE_OTHER): Admitting: Nurse Practitioner

## 2023-11-24 VITALS — BP 117/74 | HR 96 | Ht 70.0 in | Wt 170.1 lb

## 2023-11-24 DIAGNOSIS — Z9889 Other specified postprocedural states: Secondary | ICD-10-CM

## 2023-11-24 DIAGNOSIS — I739 Peripheral vascular disease, unspecified: Secondary | ICD-10-CM

## 2023-11-24 DIAGNOSIS — M170 Bilateral primary osteoarthritis of knee: Secondary | ICD-10-CM | POA: Diagnosis not present

## 2023-11-24 DIAGNOSIS — I70213 Atherosclerosis of native arteries of extremities with intermittent claudication, bilateral legs: Secondary | ICD-10-CM

## 2023-11-25 LAB — VAS US ABI WITH/WO TBI
Left ABI: 1.02
Right ABI: 1.13

## 2023-11-29 ENCOUNTER — Encounter (INDEPENDENT_AMBULATORY_CARE_PROVIDER_SITE_OTHER): Payer: Self-pay | Admitting: Nurse Practitioner

## 2023-11-29 NOTE — Progress Notes (Signed)
 Subjective:    Patient ID: Danny Castro, male    DOB: 1950-09-01, 73 y.o.   MRN: [id number] Chief Complaint  Patient presents with   Follow-up    The patient returns to the office for followup and review status post angiogram with intervention:  Procedure: 10/29/2023 Procedure(s) Performed:             1.  Ultrasound guidance for vascular access left femoral artery             2.  Catheter placement into right common femoral artery from left femoral             3.  Aortogram and selective right lower extremity angiogram             4.  Mechanical thrombectomy of the right SFA and popliteal arteries with the Rota Rex device             5.  Angioplasty of the right SFA and popliteal arteries with 5 mm diameter Lutonix drug-coated angioplasty balloons             6.  Angioplasty of the right tibioperoneal trunk and proximal peroneal artery with 3 mm diameter angioplasty balloon             7.  Stent placement to the right SFA and popliteal arteries with 6 mm diameter by 25 cm length Viabahn stent and 6 mm diameter by 10 cm length Viabahn stent             8.  Celt closure device left femoral artery   11/05/2023  Procedure(s) Performed:             1.  Ultrasound guidance for vascular access right femoral artery             2.  Catheter placement into left common femoral artery from right femoral approach             3.  Aortogram and selective left lower extremity angiogram             4.  Mechanical thrombectomy of the left SFA with the Rota Rex device             5.  Stent placement to the left SFA with 6 mm diameter by 10 cm length Viabahn stent             6.  Celt closure device right femoral artery   The patient notes improvement in the lower extremity symptoms. No interval shortening of the patient's claudication distance or rest pain symptoms. No new ulcers or wounds have occurred since the last visit.  There have been no significant changes to the patient's overall health  care.  No documented history of amaurosis fugax or recent TIA symptoms. There are no recent neurological changes noted. No documented history of DVT, PE or superficial thrombophlebitis. The patient denies recent episodes of angina or shortness of breath.   ABI's Rt=1.13 and Lt=1.02  (previous ABI's Rt=0.66 and Lt=0.71) Duplex US  of the bilateral lower extremities shows biphasic waveforms with good toe waveforms bilaterally    Review of Systems  Musculoskeletal:  Positive for arthralgias.  All other systems reviewed and are negative.      Objective:   Physical Exam Vitals reviewed.  HENT:     Head: Normocephalic.  Cardiovascular:     Rate and Rhythm: Normal rate.     Pulses:          Dorsalis  pedis pulses are detected w/ Doppler on the right side and detected w/ Doppler on the left side.       Posterior tibial pulses are detected w/ Doppler on the right side and detected w/ Doppler on the left side.  Pulmonary:     Effort: Pulmonary effort is normal.  Skin:    General: Skin is warm and dry.  Neurological:     Mental Status: He is alert and oriented to person, place, and time.  Psychiatric:        Mood and Affect: Mood normal.        Behavior: Behavior normal.        Thought Content: Thought content normal.        Judgment: Judgment normal.     BP 117/74   Pulse 96   Ht 5' 10 (1.778 m)   Wt 170 lb 2 oz (77.2 kg)   BMI 24.41 kg/m   Past Medical History:  Diagnosis Date   B12 deficiency    Calculus of kidney 08/19/2013   History of kidney stones    Nephrolithiasis    OA (osteoarthritis)    Primary osteoarthritis of both knees 04/10/2014   Primary osteoarthritis of both knees     Social History   Socioeconomic History   Marital status: Married    Spouse name: Dolores   Number of children: Not on file   Years of education: Not on file   Highest education level: Not on file  Occupational History   Not on file  Tobacco Use   Smoking status: Former     Current packs/day: 0.00    Types: Cigarettes    Quit date: 11/29/2010    Years since quitting: 13.0    Passive exposure: Past   Smokeless tobacco: Never  Vaping Use   Vaping status: Some Days   Substances: Flavoring  Substance and Sexual Activity   Alcohol use: Yes    Comment: 1 beer per month   Drug use: No   Sexual activity: Yes  Other Topics Concern   Not on file  Social History Narrative   Not on file   Social Drivers of Health   Financial Resource Strain: Low Risk  (08/14/2023)   Received from [facility]   Overall Financial Resource Strain (CARDIA)    Difficulty of Paying Living Expenses: Not hard at all  Food Insecurity: No Food Insecurity (08/14/2023)   Received from [facility]   Hunger Vital Sign    Within the past 12 months, you worried that your food would run out before you got the money to buy more.: Never true    Within the past 12 months, the food you bought just didn't last and you didn't have money to get more.: Never true  Transportation Needs: No Transportation Needs (08/14/2023)   Received from [facility] - Transportation    In the past 12 months, has lack of transportation kept you from medical appointments or from getting medications?: No    Lack of Transportation (Non-Medical): No  Physical Activity: Not on file  Stress: Not on file  Social Connections: Not on file  Intimate Partner Violence: Not on file    Past Surgical History:  Procedure Laterality Date   COLONOSCOPY     COLONOSCOPY N/A 10/09/2023   Procedure: COLONOSCOPY;  Surgeon: Unk Corinn Skiff, MD;  Location: [facility] ENDOSCOPY;  Service: Gastroenterology;  Laterality: N/A;   CYSTOSCOPY W/ URETERAL STENT PLACEMENT  Right 04/04/2016   Procedure: CYSTOSCOPY WITH STENT REPLACEMENT;  Surgeon: Redell Lynwood Napoleon, MD;  Location: [facility] ORS;  Service: Urology;  Laterality: Right;   CYSTOSCOPY WITH STENT PLACEMENT Right 03/13/2016    Procedure: CYSTOSCOPY WITH STENT PLACEMENT;  Surgeon: Redell Lynwood Napoleon, MD;  Location: [facility] ORS;  Service: Urology;  Laterality: Right;   CYSTOSCOPY/URETEROSCOPY/HOLMIUM LASER/STENT PLACEMENT Right 06/13/2022   Procedure: CYSTOSCOPY/URETEROSCOPY/HOLMIUM LASER/STENT PLACEMENT;  Surgeon: Francisca Redell BROCKS, MD;  Location: [facility] ORS;  Service: Urology;  Laterality: Right;   EXTRACORPOREAL SHOCK WAVE LITHOTRIPSY Right 01/04/2015   Procedure: EXTRACORPOREAL SHOCK WAVE LITHOTRIPSY (ESWL);  Surgeon: Charlie JONETTA Pack, MD;  Location: [facility] ORS;  Service: Urology;  Laterality: Right;   EXTRACORPOREAL SHOCK WAVE LITHOTRIPSY N/A 07/08/2018   Procedure: EXTRACORPOREAL SHOCK WAVE LITHOTRIPSY (ESWL);  Surgeon: Penne Knee, MD;  Location: [facility] ORS;  Service: Urology;  Laterality: N/A;   EXTRACORPOREAL SHOCK WAVE LITHOTRIPSY Right 02/21/2021   Procedure: EXTRACORPOREAL SHOCK WAVE LITHOTRIPSY (ESWL);  Surgeon: Francisca Redell BROCKS, MD;  Location: [facility] ORS;  Service: Urology;  Laterality: Right;   HEMOSTASIS CLIP PLACEMENT  10/09/2023   Procedure: CONTROL OF HEMORRHAGE, GI TRACT, ENDOSCOPIC, BY CLIPPING OR OVERSEWING;  Surgeon: Unk Corinn Skiff, MD;  Location: [facility] ENDOSCOPY;  Service: Gastroenterology;;   LOWER EXTREMITY ANGIOGRAPHY Right 10/29/2023   Procedure: Lower Extremity Angiography;  Surgeon: Marea Selinda RAMAN, MD;  Location: [facility] INVASIVE CV LAB;  Service: Cardiovascular;  Laterality: Right;   LOWER EXTREMITY ANGIOGRAPHY Left 11/05/2023   Procedure: Lower Extremity Angiography;  Surgeon: Marea Selinda RAMAN, MD;  Location: [facility] INVASIVE CV LAB;  Service: Cardiovascular;  Laterality: Left;   POLYPECTOMY  10/09/2023   Procedure: POLYPECTOMY, INTESTINE;  Surgeon: Unk Corinn Skiff, MD;  Location: [facility] ENDOSCOPY;  Service: Gastroenterology;;   TONSILLECTOMY     URETEROSCOPY Right 03/13/2016   Procedure: URETEROSCOPY;  Surgeon: Redell Lynwood Napoleon, MD;  Location: [facility] ORS;  Service: Urology;  Laterality: Right;   URETEROSCOPY  WITH HOLMIUM LASER LITHOTRIPSY Right 04/04/2016   Procedure: URETEROSCOPY WITH HOLMIUM LASER LITHOTRIPSY;  Surgeon: Redell Lynwood Napoleon, MD;  Location: [facility] ORS;  Service: Urology;  Laterality: Right;    Family History  Problem Relation Age of Onset   Prostate cancer Father    Nephrolithiasis Father     Allergies  Allergen Reactions   Prednisone Anxiety       Latest Ref Rng & Units 12/04/2021    2:24 PM 02/19/2021    4:00 AM 02/11/2021    4:09 AM  CBC  WBC 4.0 - 10.5 K/uL 9.8  15.9  10.8   Hemoglobin 13.0 - 17.0 g/dL 84.6  85.1  85.2   Hematocrit 39.0 - 52.0 % 46.8  45.2  44.0   Platelets 150 - 400 K/uL 261  268  244       CMP     Component Value Date/Time   NA 137 12/04/2021 1424   K 3.9 12/04/2021 1424   CL 102 12/04/2021 1424   CO2 28 12/04/2021 1424   GLUCOSE 87 12/04/2021 1424   BUN 23 11/05/2023 1142   CREATININE 0.98 11/05/2023 1142   CALCIUM  9.1 12/04/2021 1424   PROT 7.7 12/04/2021 1424   ALBUMIN 4.4 12/04/2021 1424   AST 33 12/04/2021 1424   ALT 28 12/04/2021 1424   ALKPHOS 60 12/04/2021 1424   BILITOT 1.1 12/04/2021 1424   GFRNONAA >60 11/05/2023 1142     VAS US  ABI WITH/WO TBI Result Date: 11/25/2023  LOWER EXTREMITY DOPPLER STUDY Patient Name:  Calib Wadhwa  Scholz  Date of Exam:   11/24/2023 Medical Rec #: [id number]     Accession #:    [id number] Date of Birth: 05/11/1950      Patient Gender: M Patient Age:   64 years Exam Location:  [facility] Procedure:      VAS US  ABI WITH/WO TBI Referring Phys: Selinda Gu --------------------------------------------------------------------------------  Indications: Peripheral artery disease.  Vascular Interventions: 10/29/2023: Aorta and Selective Right Lower Extremity                         Angiogram. Mechanical thrombectomy of the Right SFA and                         Popliteal Artery with Rota Rex device. Angioplasty of                         the Right SFA and Popliteal Artery wiith 5 mm diameter                          Lutonix drug coated angioplasty balloon. Angioplasty of                         the Right Tibioperoneal trunk and Proximal Peroneal                         Artery with 3 mm diameter angioplasty balloon. Stent                         placement to the Right SFA and Popliteal Artery with 6                         mm diameter by 25 cm length Viabahn stent and 6 mm                         diameter by 10 cm length Viabahn stent.                          11/11/2023: Aortogram and Selective Left Lower Extremity                         Angiogram. Mechanical Thrombectomy of the Left SFA with                         the Rota Rex device. Stent placement to the Left SFA                         with 6 mm diameter by 10 cm length Viabahn stent. Celt                         closure device Right Femoral Artery. Comparison Study: 10/01/2023 Performing Technologist: Leafy Gibes RVS  Examination Guidelines: A complete evaluation includes at minimum, Doppler waveform signals and systolic blood pressure reading at the level of bilateral brachial, anterior tibial, and posterior tibial arteries, when vessel segments are accessible. Bilateral testing is considered an integral part of a complete examination. Photoelectric Plethysmograph (PPG) waveforms and toe systolic pressure readings are included as required and  additional duplex testing as needed. Limited examinations for reoccurring indications may be performed as noted.  ABI Findings: +---------+------------------+-----+--------+--------+ Right    Rt Pressure (mmHg)IndexWaveformComment  +---------+------------------+-----+--------+--------+ Brachial 139                                     +---------+------------------+-----+--------+--------+ ATA      157               1.13 biphasic         +---------+------------------+-----+--------+--------+ PTA      149               1.07 biphasic         +---------+------------------+-----+--------+--------+  Great Toe174               1.25 Normal           +---------+------------------+-----+--------+--------+ +---------+------------------+-----+---------+-------+ Left     Lt Pressure (mmHg)IndexWaveform Comment +---------+------------------+-----+---------+-------+ Brachial 129                                     +---------+------------------+-----+---------+-------+ ATA      142               1.02 triphasic        +---------+------------------+-----+---------+-------+ PTA      130               0.94 biphasic         +---------+------------------+-----+---------+-------+ Great Toe114               0.82 Normal           +---------+------------------+-----+---------+-------+ +-------+-----------+-----------+------------+------------+ ABI/TBIToday's ABIToday's TBIPrevious ABIPrevious TBI +-------+-----------+-----------+------------+------------+ Right  1.13       1.25       .66         .39          +-------+-----------+-----------+------------+------------+ Left   1.02       .82        .71         .51          +-------+-----------+-----------+------------+------------+ Bilateral ABIs appear increased compared to prior study on 10/01/2023. Bilateral TBIs appear increased compared to prior study on 10/01/2023.  Summary: Right: Resting right ankle-brachial index is within normal range. The right toe-brachial index is normal.  Left: Resting left ankle-brachial index is within normal range. The left toe-brachial index is normal.  *See table(s) above for measurements and observations.  Electronically signed by Selinda Gu MD on 11/25/2023 at 7:26:55 AM.    Final    VAS US  ABI WITH/WO TBI Result Date: 10/06/2023  LOWER EXTREMITY DOPPLER STUDY Patient Name:  GARRIE WOODIN  Date of Exam:   10/01/2023 Medical Rec #: [id number]     Accession #:    [id number] Date of Birth: 1950-08-08      Patient Gender: M Patient Age:   57 years Exam Location:  [facility] Procedure:       VAS US  ABI WITH/WO TBI Referring Phys: [facility] --------------------------------------------------------------------------------  Indications: Peripheral artery disease.  Performing Technologist: Elsie Churn RT, RDMS, RVT  Examination Guidelines: A complete evaluation includes at minimum, Doppler waveform signals and systolic blood pressure reading at the level of bilateral brachial, anterior tibial, and posterior tibial arteries, when vessel segments are accessible. Bilateral testing is considered an integral part  of a complete examination. Photoelectric Plethysmograph (PPG) waveforms and toe systolic pressure readings are included as required and additional duplex testing as needed. Limited examinations for reoccurring indications may be performed as noted.  ABI Findings: +---------+------------------+-----+-------------------+--------------------+ Right    Rt Pressure (mmHg)IndexWaveform           Comment              +---------+------------------+-----+-------------------+--------------------+ Brachial 158                                                            +---------+------------------+-----+-------------------+--------------------+ PTA                                                not detected         +---------+------------------+-----+-------------------+--------------------+ PERO     105               0.66 monophasic         collateral by duplex +---------+------------------+-----+-------------------+--------------------+ DP       101               0.64 dampened monophasic                     +---------+------------------+-----+-------------------+--------------------+ Great Toe61                0.39 Abnormal                                +---------+------------------+-----+-------------------+--------------------+ +---------+------------------+-----+-------------------+------------+ Left     Lt Pressure (mmHg)IndexWaveform           Comment       +---------+------------------+-----+-------------------+------------+ Brachial 148                                                    +---------+------------------+-----+-------------------+------------+ PTA                                                not detected +---------+------------------+-----+-------------------+------------+ PERO     95                0.60 dampened monophasic             +---------+------------------+-----+-------------------+------------+ DP       112               0.71 monophasic                      +---------+------------------+-----+-------------------+------------+ Great Toe81                0.51 Abnormal                        +---------+------------------+-----+-------------------+------------+  Summary: Bilateral: Resting bilateral ankle-brachial index indicates moderate lower extremity arterial disease. Bilateral toe-brachial indexes are abnormal.  *See table(s)  above for measurements and observations.  Electronically signed by Cordella Shawl MD on 10/06/2023 at 7:19:23 AM.    Final        Assessment & Plan:   1. Atherosclerosis of native artery of both lower extremities with intermittent claudication (HCC) (Primary) Recommend:  The patient is status post successful angiogram with intervention.  The patient reports that the claudication symptoms and leg pain has improved.   The patient denies lifestyle limiting changes at this point in time.  No further invasive studies, angiography or surgery at this time. The patient should continue walking and begin a more formal exercise program.  The patient should continue antiplatelet therapy and aggressive treatment of the lipid abnormalities  Continued surveillance is indicated as atherosclerosis is likely to progress with time.    Patient should undergo noninvasive studies as ordered. The patient will follow up with me to review the studies.   2. Primary osteoarthritis of both  knees Continue medications to treat the patient's degenerative disease as already ordered, these medications have been reviewed and there are no changes at this time.  Continued activity and therapy was stressed.   Current Outpatient Medications on File Prior to Visit  Medication Sig Dispense Refill   aspirin  EC 81 MG tablet Take 1 tablet (81 mg total) by mouth daily. Swallow whole. 150 tablet 2   atorvastatin  (LIPITOR) 10 MG tablet Take 1 tablet (10 mg total) by mouth daily. 30 tablet 11   azelastine (ASTELIN) 0.1 % nasal spray Place 1 spray into both nostrils 2 (two) times daily.     clopidogrel  (PLAVIX ) 75 MG tablet Take 1 tablet (75 mg total) by mouth daily. 30 tablet 11   meloxicam  (MOBIC ) 15 MG tablet Take 1 tablet (15 mg total) by mouth daily. 30 tablet 0   olmesartan (BENICAR) 20 MG tablet Take 20 mg by mouth daily.     tamsulosin  (FLOMAX ) 0.4 MG CAPS capsule Take 1 capsule (0.4 mg total) by mouth daily after supper. (Patient not taking: Reported on 10/09/2023) 14 capsule 0   No current facility-administered medications on file prior to visit.    There are no Patient Instructions on file for this visit. No follow-ups on file.   Olufemi Mofield E Lynzi Meulemans, NP

## 2023-12-29 ENCOUNTER — Ambulatory Visit: Payer: Self-pay | Admitting: Urology

## 2024-01-04 ENCOUNTER — Other Ambulatory Visit: Payer: Self-pay | Admitting: Urology

## 2024-01-04 DIAGNOSIS — N200 Calculus of kidney: Secondary | ICD-10-CM

## 2024-01-04 NOTE — Progress Notes (Unsigned)
 01/05/24 4:28 PM   Danny Castro [birth year] [id number]  Referring provider:  Cleotilde Oneil FALCON, MD [phone] [facility] [address] [facility] [place],  [place] [address]  Urological history:  1. High risk hematuria  -former smoker  -CTU 11/2014 - bilateral nephrolithiasis  -cysto 2016- refused  -cysto 2018 with stent removal - NED  -cysto 2024 - NED  2. Nephrolithiasis  -Composition - 90% CaOx / 10% CaPO4  -pre 2017 MET x 3, SWL x 1  -right URS 2017  -SWL 2020   -CT renal stone (03/2022) - Bilateral nephrolithiasis. No evidence of ureteral calculi, hydronephrosis, or other acute findings. -right URS (05/2022)   3. BPH with LU TS  4. Prostate cancer screening - PSA (03/2023) 0.78  Chief Complaint  Patient presents with   Follow-up    Bilateral nephrolithiasis    HPI: Danny Castro is a 73 y.o.male who presents today for 12 month follow-up with KUB.  Previous records reviewed.   He passed a stone approximately 3 weeks ago.  He states he only had a little discomfort in the suprapubic area and then it popped out into the toilet.  He had a picture of it on his phone.  Patient denies any modifying or aggravating factors.  Patient denies any recent UTI's, gross hematuria, dysuria or suprapubic/flank pain.  Patient denies any fevers, chills, nausea or vomiting.    KUB w/ 3 stones in the left kidney ~ 4 mm being the largest and small punctate stones in the right.   UA yellow clear, specific gravity greater than 1.030, pH 6.0, 1+ heme, trace leukocyte, 0-5 WBCs, 3-10 RBCs, 0-2 epithelial cells and a few bacteria.    Serum creatinine of 0.98 with a eGFR of greater than 60 in August.  Hemoglobin A1c of 5.5 in January.  PSA of 0.78 in January.  PMH: Past Medical History:  Diagnosis Date   B12 deficiency    Calculus of kidney 08/19/2013   History of kidney stones    Nephrolithiasis    OA (osteoarthritis)    Primary osteoarthritis of both knees 04/10/2014    Primary osteoarthritis of both knees     Surgical History: Past Surgical History:  Procedure Laterality Date   COLONOSCOPY     COLONOSCOPY N/A 10/09/2023   Procedure: COLONOSCOPY;  Surgeon: Unk Corinn Skiff, MD;  Location: [facility] ENDOSCOPY;  Service: Gastroenterology;  Laterality: N/A;   CYSTOSCOPY W/ URETERAL STENT PLACEMENT Right 04/04/2016   Procedure: CYSTOSCOPY WITH STENT REPLACEMENT;  Surgeon: Redell Lynwood Napoleon, MD;  Location: [facility] ORS;  Service: Urology;  Laterality: Right;   CYSTOSCOPY WITH STENT PLACEMENT Right 03/13/2016   Procedure: CYSTOSCOPY WITH STENT PLACEMENT;  Surgeon: Redell Lynwood Napoleon, MD;  Location: [facility] ORS;  Service: Urology;  Laterality: Right;   CYSTOSCOPY/URETEROSCOPY/HOLMIUM LASER/STENT PLACEMENT Right 06/13/2022   Procedure: CYSTOSCOPY/URETEROSCOPY/HOLMIUM LASER/STENT PLACEMENT;  Surgeon: Francisca Redell BROCKS, MD;  Location: [facility] ORS;  Service: Urology;  Laterality: Right;   EXTRACORPOREAL SHOCK WAVE LITHOTRIPSY Right 01/04/2015   Procedure: EXTRACORPOREAL SHOCK WAVE LITHOTRIPSY (ESWL);  Surgeon: Charlie JONETTA Pack, MD;  Location: [facility] ORS;  Service: Urology;  Laterality: Right;   EXTRACORPOREAL SHOCK WAVE LITHOTRIPSY N/A 07/08/2018   Procedure: EXTRACORPOREAL SHOCK WAVE LITHOTRIPSY (ESWL);  Surgeon: Penne Knee, MD;  Location: [facility] ORS;  Service: Urology;  Laterality: N/A;   EXTRACORPOREAL SHOCK WAVE LITHOTRIPSY Right 02/21/2021   Procedure: EXTRACORPOREAL SHOCK WAVE LITHOTRIPSY (ESWL);  Surgeon: Francisca Redell BROCKS, MD;  Location: [facility] ORS;  Service: Urology;  Laterality: Right;  HEMOSTASIS CLIP PLACEMENT  10/09/2023   Procedure: CONTROL OF HEMORRHAGE, GI TRACT, ENDOSCOPIC, BY CLIPPING OR OVERSEWING;  Surgeon: Unk Corinn Skiff, MD;  Location: [facility] ENDOSCOPY;  Service: Gastroenterology;;   LOWER EXTREMITY ANGIOGRAPHY Right 10/29/2023   Procedure: Lower Extremity Angiography;  Surgeon: Marea Selinda RAMAN, MD;  Location: [facility] INVASIVE CV LAB;  Service: Cardiovascular;   Laterality: Right;   LOWER EXTREMITY ANGIOGRAPHY Left 11/05/2023   Procedure: Lower Extremity Angiography;  Surgeon: Marea Selinda RAMAN, MD;  Location: [facility] INVASIVE CV LAB;  Service: Cardiovascular;  Laterality: Left;   POLYPECTOMY  10/09/2023   Procedure: POLYPECTOMY, INTESTINE;  Surgeon: Unk Corinn Skiff, MD;  Location: [facility] ENDOSCOPY;  Service: Gastroenterology;;   TONSILLECTOMY     URETEROSCOPY Right 03/13/2016   Procedure: URETEROSCOPY;  Surgeon: Redell Lynwood Napoleon, MD;  Location: [facility] ORS;  Service: Urology;  Laterality: Right;   URETEROSCOPY WITH HOLMIUM LASER LITHOTRIPSY Right 04/04/2016   Procedure: URETEROSCOPY WITH HOLMIUM LASER LITHOTRIPSY;  Surgeon: Redell Lynwood Napoleon, MD;  Location: [facility] ORS;  Service: Urology;  Laterality: Right;    Home Medications:  Allergies as of 01/05/2024   No Active Allergies      Medication List        Accurate as of January 05, 2024  4:28 PM. If you have any questions, ask your nurse or doctor.          aspirin  EC 81 MG tablet Take 1 tablet (81 mg total) by mouth daily. Swallow whole.   atorvastatin  10 MG tablet Commonly known as: Lipitor Take 1 tablet (10 mg total) by mouth daily.   azelastine 0.1 % nasal spray Commonly known as: ASTELIN Place 1 spray into both nostrils 2 (two) times daily.   clopidogrel  75 MG tablet Commonly known as: Plavix  Take 1 tablet (75 mg total) by mouth daily.   meloxicam  15 MG tablet Commonly known as: MOBIC  Take 1 tablet (15 mg total) by mouth daily.   olmesartan 20 MG tablet Commonly known as: BENICAR Take 20 mg by mouth daily.   tamsulosin  0.4 MG Caps capsule Commonly known as: FLOMAX  Take 1 capsule (0.4 mg total) by mouth daily after supper.        Allergies:  No Active Allergies   Family History: Family History  Problem Relation Age of Onset   Prostate cancer Father    Nephrolithiasis Father     Social History:  reports that he quit smoking about 13 years ago. His smoking use  included cigarettes. He has been exposed to tobacco smoke. He has never used smokeless tobacco. He reports current alcohol use. He reports that he does not use drugs.   Physical Exam: BP 120/83   Pulse 97   Ht 5' 9 (1.753 m)   Wt 171 lb 6.4 oz (77.7 kg)   BMI 25.31 kg/m   Constitutional:  Well nourished. Alert and oriented, No acute distress. HEENT: [place] AT, moist mucus membranes.  Trachea midline Cardiovascular: No clubbing, cyanosis, or edema. Respiratory: Normal respiratory effort, no increased work of breathing. Neurologic: Grossly intact, no focal deficits, moving all 4 extremities. Psychiatric: Normal mood and affect.   Laboratory Data: See HPI and EPIC I have reviewed the labs.    Pertinent Imaging: KUB bilateral nephrolithiasis, radiologist interpretation pending I have independently reviewed the films.  See HPI.     Assessment & Plan:    1. Bilateral nephrolithiasis - Passed 1 stone just a few weeks ago without issue - Approximate 3 stones remaining in the left kidney and  some small punctate stones on the right - Continue to monitor - Follow-up 1 year with KUB - Advised to notify us  if he should experience gross hematuria or intractable flank pain or fevers  2.  Microscopic hematuria - Stable microscopic hematuria - Continue to monitor for any worsening  Return in about 1 year (around 01/04/2025) for KUB, UA .  [facility] [address], [address] [place], [place] [address] [phone]

## 2024-01-05 ENCOUNTER — Ambulatory Visit
Admission: RE | Admit: 2024-01-05 | Discharge: 2024-01-05 | Disposition: A | Discharge status: Home / Self Care | Source: Ambulatory Visit | Attending: Urology | Admitting: Urology

## 2024-01-05 ENCOUNTER — Ambulatory Visit: Admitting: Urology

## 2024-01-05 ENCOUNTER — Encounter: Payer: Self-pay | Admitting: Urology

## 2024-01-05 VITALS — BP 120/83 | HR 97 | Ht 69.0 in | Wt 171.4 lb

## 2024-01-05 DIAGNOSIS — R3129 Other microscopic hematuria: Secondary | ICD-10-CM | POA: Diagnosis not present

## 2024-01-05 DIAGNOSIS — N200 Calculus of kidney: Secondary | ICD-10-CM | POA: Diagnosis not present

## 2024-01-05 LAB — URINALYSIS, COMPLETE
Bilirubin, UA: NEGATIVE
Glucose, UA: NEGATIVE
Leukocytes,UA: NEGATIVE
Nitrite, UA: NEGATIVE
Protein,UA: NEGATIVE
Specific Gravity, UA: 1.03 (ref 1.005–1.030)
Urobilinogen, Ur: 1 mg/dL (ref 0.2–1.0)
pH, UA: 6 (ref 5.0–7.5)

## 2024-01-05 LAB — MICROSCOPIC EXAMINATION

## 2024-02-05 ENCOUNTER — Other Ambulatory Visit: Payer: Self-pay | Admitting: Orthopaedic Surgery

## 2024-02-05 DIAGNOSIS — M79672 Pain in left foot: Secondary | ICD-10-CM

## 2024-02-10 ENCOUNTER — Other Ambulatory Visit: Payer: Self-pay | Admitting: Orthopaedic Surgery

## 2024-02-10 ENCOUNTER — Ambulatory Visit
Admission: RE | Admit: 2024-02-10 | Discharge: 2024-02-10 | Disposition: A | Discharge status: Home / Self Care | Source: Ambulatory Visit | Attending: Orthopaedic Surgery | Admitting: Orthopaedic Surgery

## 2024-02-10 DIAGNOSIS — M79672 Pain in left foot: Secondary | ICD-10-CM | POA: Diagnosis present

## 2024-02-16 ENCOUNTER — Other Ambulatory Visit (INDEPENDENT_AMBULATORY_CARE_PROVIDER_SITE_OTHER): Payer: Self-pay | Admitting: Nurse Practitioner

## 2024-02-16 DIAGNOSIS — Z9889 Other specified postprocedural states: Secondary | ICD-10-CM

## 2024-02-17 ENCOUNTER — Encounter: Payer: Self-pay | Admitting: Orthopaedic Surgery

## 2024-02-17 DIAGNOSIS — S92331G Displaced fracture of third metatarsal bone, right foot, subsequent encounter for fracture with delayed healing: Secondary | ICD-10-CM

## 2024-02-18 ENCOUNTER — Other Ambulatory Visit: Payer: Self-pay | Admitting: Orthopaedic Surgery

## 2024-02-18 DIAGNOSIS — S92331G Displaced fracture of third metatarsal bone, right foot, subsequent encounter for fracture with delayed healing: Secondary | ICD-10-CM

## 2024-02-22 ENCOUNTER — Ambulatory Visit: Admission: RE | Admit: 2024-02-22 | Discharge: 2024-02-22 | Attending: Orthopaedic Surgery

## 2024-02-22 DIAGNOSIS — S92331G Displaced fracture of third metatarsal bone, right foot, subsequent encounter for fracture with delayed healing: Secondary | ICD-10-CM

## 2024-02-24 ENCOUNTER — Ambulatory Visit (INDEPENDENT_AMBULATORY_CARE_PROVIDER_SITE_OTHER)

## 2024-02-24 ENCOUNTER — Other Ambulatory Visit (INDEPENDENT_AMBULATORY_CARE_PROVIDER_SITE_OTHER)

## 2024-02-24 ENCOUNTER — Ambulatory Visit (INDEPENDENT_AMBULATORY_CARE_PROVIDER_SITE_OTHER): Admitting: Nurse Practitioner

## 2024-02-24 ENCOUNTER — Encounter (INDEPENDENT_AMBULATORY_CARE_PROVIDER_SITE_OTHER): Payer: Self-pay | Admitting: Nurse Practitioner

## 2024-02-24 VITALS — BP 164/95 | HR 86 | Resp 18 | Ht 69.0 in | Wt 178.0 lb

## 2024-02-24 DIAGNOSIS — I739 Peripheral vascular disease, unspecified: Secondary | ICD-10-CM

## 2024-02-24 DIAGNOSIS — Z9889 Other specified postprocedural states: Secondary | ICD-10-CM | POA: Diagnosis not present

## 2024-02-24 DIAGNOSIS — M170 Bilateral primary osteoarthritis of knee: Secondary | ICD-10-CM

## 2024-02-29 ENCOUNTER — Encounter (INDEPENDENT_AMBULATORY_CARE_PROVIDER_SITE_OTHER): Payer: Self-pay | Admitting: Nurse Practitioner

## 2024-02-29 LAB — VAS US ABI WITH/WO TBI
Left ABI: 1.18
Right ABI: 1.13

## 2024-02-29 NOTE — Progress Notes (Signed)
 Subjective:    Patient ID: Danny Castro, male    DOB: 1950-07-24, 73 y.o.   MRN: [id number] Chief Complaint  Patient presents with   Follow-up    3 month follow up + ABI + Bilateral ART duplex    HPI  Discussed the use of AI scribe software for clinical note transcription with the patient, who gave verbal consent to proceed.  History of Present Illness Danny Castro is a 73 year old male with peripheral artery disease who presents for a three-month follow-up after stent placement.  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 Previous Ankle-Brachial Index (ABI) measurements were 1.13 on the right and 1.02 on the left. Current measurements show the right remains at 1.13, while the left has improved to 1.18.  He denies smoking but uses a product with 1% nicotine. He has stopped using it at home, resulting in a weight gain of 20 pounds due to increased snacking.  He is currently taking a blood thinner and a statin. He has no issues with the blood thinner. Family history reveals that his mother had several stents placed in her legs. He denies weakness or giving out of his legs.    Results RADIOLOGY Lower Extremity Angiography: Stents wide open, left leg with mild stenosis but good flow (02/24/2024)  DIAGNOSTIC ABI Right: 1.13 (02/24/2024) ABI Left: 1.18 (02/24/2024)   Review of Systems  All other systems reviewed and are negative.      Objective:   Physical Exam Vitals reviewed.  HENT:     Head: Normocephalic.  Cardiovascular:     Pulses:          Dorsalis pedis pulses are detected w/ Doppler on the right side and detected w/ Doppler on the left side.       Posterior tibial pulses are detected w/ Doppler on the right side and detected w/ Doppler on the left side.  Pulmonary:     Effort: Pulmonary effort is normal.  Skin:    General: Skin is warm and dry.  Neurological:     Mental Status: He is alert and oriented to person, place, and time.   Psychiatric:        Mood and Affect: Mood normal.        Behavior: Behavior normal.        Thought Content: Thought content normal.        Judgment: Judgment normal.     Physical Exam    BP (!) 164/95 (BP Location: Right Arm, Patient Position: Sitting, Cuff Size: Normal)   Pulse 86   Resp 18   Ht 5' 9 (1.753 m)   Wt 178 lb (80.7 kg)   BMI 26.29 kg/m   Past Medical History:  Diagnosis Date   B12 deficiency    Calculus of kidney 08/19/2013   History of kidney stones    Nephrolithiasis    OA (osteoarthritis)    Primary osteoarthritis of both knees 04/10/2014   Primary osteoarthritis of both knees     Social History   Socioeconomic History   Marital status: Married    Spouse name: Dolores   Number of children: Not on file   Years of education: Not on file   Highest education level: Not on file  Occupational History   Not on file  Tobacco Use   Smoking status: Former    Current packs/day: 0.00    Types: Cigarettes    Quit date: 11/29/2010    Years since  quitting: 13.2    Passive exposure: Past   Smokeless tobacco: Never  Vaping Use   Vaping status: Some Days   Substances: Flavoring  Substance and Sexual Activity   Alcohol use: Yes    Comment: 1 beer per month   Drug use: No   Sexual activity: Yes  Other Topics Concern   Not on file  Social History Narrative   Not on file   Social Drivers of Health   Tobacco Use: Medium Risk (02/24/2024)   Patient History    Smoking Tobacco Use: Former    Smokeless Tobacco Use: Never    Passive Exposure: Past  Physicist, Medical Strain: Low Risk  (08/14/2023)   Received from [facility]   Overall Financial Resource Strain (CARDIA)    Difficulty of Paying Living Expenses: Not hard at all  Food Insecurity: No Food Insecurity (08/14/2023)   Received from [facility]   Epic    Within the past 12 months, you worried that your food would run out before you got the money to buy more.:  Never true    Within the past 12 months, the food you bought just didn't last and you didn't have money to get more.: Never true  Transportation Needs: No Transportation Needs (08/14/2023)   Received from [facility] - Transportation    In the past 12 months, has lack of transportation kept you from medical appointments or from getting medications?: No    Lack of Transportation (Non-Medical): No  Physical Activity: Not on file  Stress: Not on file  Social Connections: Not on file  Intimate Partner Violence: Not on file  Depression (EYV7-0): Not on file  Alcohol Screen: Not on file  Housing: Low Risk  (08/14/2023)   Received from [facility]   Epic    In the last 12 months, was there a time when you were not able to pay the mortgage or rent on time?: No    In the past 12 months, how many times have you moved where you were living?: 0    At any time in the past 12 months, were you homeless or living in a shelter (including now)?: No  Utilities: Not At Risk (08/14/2023)   Received from [facility] Utilities    Threatened with loss of utilities: No  Health Literacy: Not on file    Past Surgical History:  Procedure Laterality Date   COLONOSCOPY     COLONOSCOPY N/A 10/09/2023   Procedure: COLONOSCOPY;  Surgeon: Unk Corinn Skiff, MD;  Location: [facility] ENDOSCOPY;  Service: Gastroenterology;  Laterality: N/A;   CYSTOSCOPY W/ URETERAL STENT PLACEMENT Right 04/04/2016   Procedure: CYSTOSCOPY WITH STENT REPLACEMENT;  Surgeon: Redell Lynwood Napoleon, MD;  Location: [facility] ORS;  Service: Urology;  Laterality: Right;   CYSTOSCOPY WITH STENT PLACEMENT Right 03/13/2016   Procedure: CYSTOSCOPY WITH STENT PLACEMENT;  Surgeon: Redell Lynwood Napoleon, MD;  Location: [facility] ORS;  Service: Urology;  Laterality: Right;   CYSTOSCOPY/URETEROSCOPY/HOLMIUM LASER/STENT PLACEMENT Right 06/13/2022   Procedure: CYSTOSCOPY/URETEROSCOPY/HOLMIUM LASER/STENT  PLACEMENT;  Surgeon: Francisca Redell BROCKS, MD;  Location: [facility] ORS;  Service: Urology;  Laterality: Right;   EXTRACORPOREAL SHOCK WAVE LITHOTRIPSY Right 01/04/2015   Procedure: EXTRACORPOREAL SHOCK WAVE LITHOTRIPSY (ESWL);  Surgeon: Charlie JONETTA Pack, MD;  Location: [facility] ORS;  Service: Urology;  Laterality: Right;   EXTRACORPOREAL SHOCK WAVE LITHOTRIPSY N/A 07/08/2018   Procedure: EXTRACORPOREAL SHOCK WAVE LITHOTRIPSY (ESWL);  Surgeon:  Penne Knee, MD;  Location: [facility] ORS;  Service: Urology;  Laterality: N/A;   EXTRACORPOREAL SHOCK WAVE LITHOTRIPSY Right 02/21/2021   Procedure: EXTRACORPOREAL SHOCK WAVE LITHOTRIPSY (ESWL);  Surgeon: Francisca Redell BROCKS, MD;  Location: [facility] ORS;  Service: Urology;  Laterality: Right;   HEMOSTASIS CLIP PLACEMENT  10/09/2023   Procedure: CONTROL OF HEMORRHAGE, GI TRACT, ENDOSCOPIC, BY CLIPPING OR OVERSEWING;  Surgeon: Unk Corinn Skiff, MD;  Location: [facility] ENDOSCOPY;  Service: Gastroenterology;;   LOWER EXTREMITY ANGIOGRAPHY Right 10/29/2023   Procedure: Lower Extremity Angiography;  Surgeon: Marea Selinda RAMAN, MD;  Location: [facility] INVASIVE CV LAB;  Service: Cardiovascular;  Laterality: Right;   LOWER EXTREMITY ANGIOGRAPHY Left 11/05/2023   Procedure: Lower Extremity Angiography;  Surgeon: Marea Selinda RAMAN, MD;  Location: [facility] INVASIVE CV LAB;  Service: Cardiovascular;  Laterality: Left;   POLYPECTOMY  10/09/2023   Procedure: POLYPECTOMY, INTESTINE;  Surgeon: Unk Corinn Skiff, MD;  Location: [facility] ENDOSCOPY;  Service: Gastroenterology;;   TONSILLECTOMY     URETEROSCOPY Right 03/13/2016   Procedure: URETEROSCOPY;  Surgeon: Redell Lynwood Napoleon, MD;  Location: [facility] ORS;  Service: Urology;  Laterality: Right;   URETEROSCOPY WITH HOLMIUM LASER LITHOTRIPSY Right 04/04/2016   Procedure: URETEROSCOPY WITH HOLMIUM LASER LITHOTRIPSY;  Surgeon: Redell Lynwood Napoleon, MD;  Location: [facility] ORS;  Service: Urology;  Laterality: Right;    Family History  Problem Relation Age of Onset   Prostate cancer  Father    Nephrolithiasis Father     Allergies[1]     Latest Ref Rng & Units 12/04/2021    2:24 PM 02/19/2021    4:00 AM 02/11/2021    4:09 AM  CBC  WBC 4.0 - 10.5 K/uL 9.8  15.9  10.8   Hemoglobin 13.0 - 17.0 g/dL 84.6  85.1  85.2   Hematocrit 39.0 - 52.0 % 46.8  45.2  44.0   Platelets 150 - 400 K/uL 261  268  244       CMP     Component Value Date/Time   NA 137 12/04/2021 1424   K 3.9 12/04/2021 1424   CL 102 12/04/2021 1424   CO2 28 12/04/2021 1424   GLUCOSE 87 12/04/2021 1424   BUN 23 11/05/2023 1142   CREATININE 0.98 11/05/2023 1142   CALCIUM  9.1 12/04/2021 1424   PROT 7.7 12/04/2021 1424   ALBUMIN 4.4 12/04/2021 1424   AST 33 12/04/2021 1424   ALT 28 12/04/2021 1424   ALKPHOS 60 12/04/2021 1424   BILITOT 1.1 12/04/2021 1424   GFRNONAA >60 11/05/2023 1142     No results found.     Assessment & Plan:   1. Peripheral arterial disease with history of revascularization (Primary) Atherosclerosis of native arteries of both lower extremities with intermittent claudication ABI improved from 1.02 to 1.18 on the left. Stents patent with good flow, mild stenosis at left stent ends. No current pain. Walking aids collateral vessel development. Smoking cessation reduces inflammation and plaque formation. Genetic factors may contribute to plaque formation despite low cholesterol. - Continue walking and physical activity. - Advised smoking cessation. - Continue current medications including statin and Plavix .  2. Primary osteoarthritis of both knees Continue medications to treat the patient's degenerative disease as already ordered, these medications have been reviewed and there are no changes at this time.  Continued activity and therapy was stressed.     Medications Ordered Prior to Encounter[2]  There are no Patient Instructions on file for this visit. No follow-ups on file.   Elga Santy E Kayson Tasker, NP      [  1] No Active Allergies [2]  Current Outpatient  Medications on File Prior to Visit  Medication Sig Dispense Refill   aspirin  EC 81 MG tablet Take 1 tablet (81 mg total) by mouth daily. Swallow whole. 150 tablet 2   atorvastatin  (LIPITOR) 10 MG tablet Take 1 tablet (10 mg total) by mouth daily. 30 tablet 11   azelastine (ASTELIN) 0.1 % nasal spray Place 1 spray into both nostrils 2 (two) times daily.     clopidogrel  (PLAVIX ) 75 MG tablet Take 1 tablet (75 mg total) by mouth daily. 30 tablet 11   meloxicam  (MOBIC ) 15 MG tablet Take 1 tablet (15 mg total) by mouth daily. 30 tablet 0   olmesartan (BENICAR) 20 MG tablet Take 20 mg by mouth daily.     tamsulosin  (FLOMAX ) 0.4 MG CAPS capsule Take 1 capsule (0.4 mg total) by mouth daily after supper. (Patient not taking: Reported on 02/24/2024) 14 capsule 0   No current facility-administered medications on file prior to visit.

## 2024-08-24 ENCOUNTER — Encounter (INDEPENDENT_AMBULATORY_CARE_PROVIDER_SITE_OTHER)

## 2024-08-24 ENCOUNTER — Ambulatory Visit (INDEPENDENT_AMBULATORY_CARE_PROVIDER_SITE_OTHER): Admitting: Nurse Practitioner

## 2025-01-03 ENCOUNTER — Ambulatory Visit: Admitting: Urology
